# Patient Record
Sex: Male | Born: 1960 | Race: White | Hispanic: No | Marital: Married | Smoking: Current every day smoker
Health system: Southern US, Community
[De-identification: ages and names within clinical notes are randomized; demographics above are authoritative.]

## PROBLEM LIST (undated history)

## (undated) DIAGNOSIS — I10 Essential (primary) hypertension: Secondary | ICD-10-CM

## (undated) DIAGNOSIS — J449 Chronic obstructive pulmonary disease, unspecified: Secondary | ICD-10-CM

## (undated) HISTORY — PX: TONSILLECTOMY: SUR1361

---

## 2004-07-18 ENCOUNTER — Ambulatory Visit: Payer: Self-pay

## 2017-10-16 ENCOUNTER — Telehealth: Payer: Self-pay

## 2017-10-16 ENCOUNTER — Other Ambulatory Visit: Payer: Self-pay

## 2017-10-16 DIAGNOSIS — D508 Other iron deficiency anemias: Secondary | ICD-10-CM

## 2017-10-16 NOTE — Telephone Encounter (Signed)
Pt states he is returning phone call to [facility] best call back # [phone].

## 2017-10-16 NOTE — Telephone Encounter (Signed)
I left a voicemail asking him to call to discuss critical lab values and Dr Leland Her orders.

## 2017-10-16 NOTE — Telephone Encounter (Signed)
Second voicemail.

## 2017-10-16 NOTE — Telephone Encounter (Deleted)
Orders in Epic for blood transfusion, call out to Short Stay at [facility] to schedule.  Will notify patient of plans so far and then will let him know when scheduled.

## 2017-10-17 ENCOUNTER — Telehealth: Payer: Self-pay

## 2017-10-17 NOTE — Telephone Encounter (Signed)
Left message for the patient to call back.  Appointment at [facility] [place] 8 am 10/18/17 [place]

## 2017-10-17 NOTE — Telephone Encounter (Deleted)
-----   Message from Jackquline Denmark, MD sent at 10/16/2017 12:07 PM EDT ----- Regarding: RE: Critical lab Can be please transfuse him 2 units of PRBC, each over 3 hours, Lasix 40 mg IV in between. Posttransfusion CBC May have to arrange it at [place] ----- Message ----- From: Debbora Lacrosse, RN Sent: 10/16/2017  10:47 AM To: Jackquline Denmark, MD Subject: Critical lab                                   Hgb 7.6 Hct 23.9  Please advise.  Thank you.

## 2017-10-18 ENCOUNTER — Encounter (HOSPITAL_COMMUNITY): Payer: Self-pay

## 2019-04-04 ENCOUNTER — Emergency Department
Admission: EM | Admit: 2019-04-04 | Discharge: 2019-04-04 | Disposition: A | Discharge status: Home / Self Care | Payer: BC Managed Care – PPO | Attending: Emergency Medicine | Admitting: Emergency Medicine

## 2019-04-04 ENCOUNTER — Emergency Department: Payer: BC Managed Care – PPO

## 2019-04-04 ENCOUNTER — Other Ambulatory Visit: Payer: Self-pay

## 2019-04-04 ENCOUNTER — Encounter: Payer: Self-pay | Admitting: Emergency Medicine

## 2019-04-04 DIAGNOSIS — F17210 Nicotine dependence, cigarettes, uncomplicated: Secondary | ICD-10-CM | POA: Insufficient documentation

## 2019-04-04 DIAGNOSIS — R05 Cough: Secondary | ICD-10-CM | POA: Diagnosis present

## 2019-04-04 DIAGNOSIS — I10 Essential (primary) hypertension: Secondary | ICD-10-CM | POA: Diagnosis not present

## 2019-04-04 DIAGNOSIS — J40 Bronchitis, not specified as acute or chronic: Secondary | ICD-10-CM | POA: Insufficient documentation

## 2019-04-04 HISTORY — DX: Essential (primary) hypertension: I10

## 2019-04-04 HISTORY — DX: Chronic obstructive pulmonary disease, unspecified: J44.9

## 2019-04-04 MED ORDER — PREDNISONE 10 MG PO TABS: ORAL_TABLET | ORAL | 0 refills | Status: DC

## 2019-04-04 MED ORDER — AZITHROMYCIN 500 MG PO TABS
500.0000 mg | ORAL_TABLET | Freq: Once | ORAL | Status: AC
  Administered 2019-04-04: 500 mg via ORAL
  Filled 2019-04-04: qty 1

## 2019-04-04 MED ORDER — PREDNISONE 20 MG PO TABS
60.0000 mg | ORAL_TABLET | Freq: Once | ORAL | Status: AC
  Administered 2019-04-04: 21:00:00 60 mg via ORAL
  Filled 2019-04-04: qty 3

## 2019-04-04 MED ORDER — AZITHROMYCIN 250 MG PO TABS: ORAL_TABLET | ORAL | 0 refills | Status: DC

## 2019-04-04 NOTE — ED Triage Notes (Signed)
Patient states that he has been congested over the past week. Patient states that he has had some coughing with the mucous. Patient states that today he had some streaks of blood in the mucous.

## 2019-04-04 NOTE — ED Provider Notes (Signed)
[facility] Emergency Department Provider Note  ____________________________________________  Time seen: Approximately 9:12 PM  I have reviewed the triage vital signs and the nursing notes.   HISTORY  Chief Complaint Nasal Congestion    HPI Zlatan Hornback is a 58 y.o. male that presents to emergency department for evaluation of intermittent nonproductive cough for a couple of days.  Patient states that if not really a cough because it is induced as he is trying to get phlegm out.  Patient states that after a very forceful episode of coughing this eveni9ng, he coughed up a streak of blood.  There were no clots.  This is never happened before.  Patient gets bronchitis every year around this time.  This feels like his bronchitis.  He is usually started on steroids and antibiotics.  He smokes a pack of cigarettes per day.  He has a history of COPD.  He does not use any inhalers.  He feels well overall.  No fevers, nasal congestion, shortness of breath, chest pain, vomiting, diarrhea.   Past Medical History:  Diagnosis Date  . COPD (chronic obstructive pulmonary disease) ([place])   . Hypertension     There are no active problems to display for this patient.   Past Surgical History:  Procedure Laterality Date  . TONSILLECTOMY      Prior to Admission medications   Medication Sig Start Date End Date Taking? Authorizing Provider  azithromycin (ZITHROMAX Z-PAK) 250 MG tablet Take 2 tablets (500 mg) on  Day 1,  followed by 1 tablet (250 mg) once daily on Days 2 through 5. 04/04/19   Laban Emperor, PA-C  predniSONE (DELTASONE) 10 MG tablet Take 6 tablets on day 1, take 5 tablets on day 2, take 4 tablets on day 3, take 3 tablets on day 4, take 2 tablets on day 5, take 1 tablet on day 6 04/04/19   Laban Emperor, PA-C    Allergies Lisinopril  No family history on file.  Social History Social History   Tobacco Use  . Smoking status: Current Every Day Smoker  .  Smokeless tobacco: Never Used  Substance Use Topics  . Alcohol use: Not on file  . Drug use: Not on file     Review of Systems  Constitutional: No fever/chills Eyes: No visual changes. No discharge. ENT: Negative for congestion and rhinorrhea. Cardiovascular: No chest pain. Respiratory: Positive for cough. No SOB. Gastrointestinal: No abdominal pain.  No nausea, no vomiting.  No diarrhea.  No constipation. Musculoskeletal: Negative for musculoskeletal pain. Skin: Negative for rash, abrasions, lacerations, ecchymosis. Neurological: Negative for headaches.   ____________________________________________   PHYSICAL EXAM:  VITAL SIGNS: ED Triage Vitals  Enc Vitals Group     BP 04/04/19 1917 (!) 157/116     Pulse Rate 04/04/19 1917 61     Resp 04/04/19 1917 18     Temp 04/04/19 1917 98.7 F (37.1 C)     Temp Source 04/04/19 1917 Oral     SpO2 04/04/19 1917 98 %     Weight 04/04/19 1918 160 lb (72.6 kg)     Height 04/04/19 1918 6' (1.829 m)     Head Circumference --      Peak Flow --      Pain Score 04/04/19 1917 0     Pain Loc --      Pain Edu? --      Excl. in [place]? --      Constitutional: Alert and oriented. Well appearing and  in no acute distress. Eyes: Conjunctivae are normal. PERRL. EOMI. No discharge. Head: Atraumatic. ENT: No frontal and maxillary sinus tenderness.      Ears: Tympanic membranes pearly gray with good landmarks. No discharge.      Nose: No congestion/rhinnorhea.      Mouth/Throat: Mucous membranes are moist. Oropharynx non-erythematous. Tonsils not enlarged. No exudates. Uvula midline. Neck: No stridor.   Hematological/Lymphatic/Immunilogical: No cervical lymphadenopathy. Cardiovascular: Normal rate, regular rhythm.  Good peripheral circulation. Respiratory: Normal respiratory effort without tachypnea or retractions. Lungs CTAB. Good air entry to the bases with no decreased or absent breath sounds. Gastrointestinal: Bowel sounds 4 quadrants. Soft  and nontender to palpation. No guarding or rigidity. No palpable masses. No distention. Musculoskeletal: Full range of motion to all extremities. No gross deformities appreciated. Neurologic:  Normal speech and language. No gross focal neurologic deficits are appreciated.  Skin:  Skin is warm, dry and intact. No rash noted. Psychiatric: Mood and affect are normal. Speech and behavior are normal. Patient exhibits appropriate insight and judgement.   ____________________________________________   LABS (all labs ordered are listed, but only abnormal results are displayed)  Labs Reviewed - No data to display ____________________________________________  EKG  SB ____________________________________________  RADIOLOGY I, Laban Emperor, personally viewed and evaluated these images (plain radiographs) as part of my medical decision making, as well as reviewing the written report by the radiologist.  Dg Chest Portable 1 View  Result Date: 04/04/2019 CLINICAL DATA:  Cough, congestion EXAM: PORTABLE CHEST 1 VIEW COMPARISON:  None. FINDINGS: Heart and mediastinal contours are within normal limits. No focal opacities or effusions. No acute bony abnormality. IMPRESSION: No active disease. Electronically Signed   By: Rolm Baptise M.D.   On: 04/04/2019 20:42    ____________________________________________    PROCEDURES  Procedure(s) performed:    Procedures    Medications  azithromycin (ZITHROMAX) tablet 500 mg (500 mg Oral Given 04/04/19 2119)  predniSONE (DELTASONE) tablet 60 mg (60 mg Oral Given 04/04/19 2119)     ____________________________________________   INITIAL IMPRESSION / ASSESSMENT AND PLAN / ED COURSE  Pertinent labs & imaging results that were available during my care of the patient were reviewed by me and considered in my medical decision making (see chart for details).  Review of the [place] CSRS was performed in accordance of the [place] prior to dispensing any  controlled drugs.   Patient's diagnosis is consistent with bronchitis. Vital signs and exam are reassuring. Patient appears well and is staying well hydrated. Patient should alternate tylenol and ibuprofen for fever. Patient feels comfortable going home. Patient will be discharged home with prescriptions for prednisone and azithromycin. Patient is to follow up with PCP as needed or otherwise directed. Patient is given ED precautions to return to the ED for any worsening or new symptoms.  Laymon Stockert was evaluated in Emergency Department on 04/04/2019 for the symptoms described in the history of present illness. He was evaluated in the context of the global COVID-19 pandemic, which necessitated consideration that the patient might be at risk for infection with the SARS-CoV-2 virus that causes COVID-19. Institutional protocols and algorithms that pertain to the evaluation of patients at risk for COVID-19 are in a state of rapid change based on information released by regulatory bodies including the CDC and federal and state organizations. These policies and algorithms were followed during the patient's care in the ED.   ____________________________________________  FINAL CLINICAL IMPRESSION(S) / ED DIAGNOSES  Final diagnoses:  Bronchitis  NEW MEDICATIONS STARTED DURING THIS VISIT:  ED Discharge Orders         Ordered    azithromycin (ZITHROMAX Z-PAK) 250 MG tablet     04/04/19 2115    predniSONE (DELTASONE) 10 MG tablet     04/04/19 2115              This chart was dictated using voice recognition software/Dragon. Despite best efforts to proofread, errors can occur which can change the meaning. Any change was purely unintentional.    Laban Emperor, PA-C 04/04/19 2128    Harvest Dark, MD 04/04/19 2244

## 2019-08-04 ENCOUNTER — Encounter: Payer: Self-pay | Admitting: Emergency Medicine

## 2019-08-04 ENCOUNTER — Emergency Department: Payer: BC Managed Care – PPO

## 2019-08-04 ENCOUNTER — Inpatient Hospital Stay
Admission: EM | Admit: 2019-08-04 | Discharge: 2019-09-05 | DRG: 208 | Disposition: E | Payer: BC Managed Care – PPO | Attending: Pulmonary Disease | Admitting: Pulmonary Disease

## 2019-08-04 ENCOUNTER — Other Ambulatory Visit: Payer: Self-pay

## 2019-08-04 DIAGNOSIS — C7801 Secondary malignant neoplasm of right lung: Secondary | ICD-10-CM | POA: Diagnosis present

## 2019-08-04 DIAGNOSIS — Z20822 Contact with and (suspected) exposure to covid-19: Secondary | ICD-10-CM | POA: Diagnosis present

## 2019-08-04 DIAGNOSIS — Z66 Do not resuscitate: Secondary | ICD-10-CM | POA: Diagnosis not present

## 2019-08-04 DIAGNOSIS — C801 Malignant (primary) neoplasm, unspecified: Secondary | ICD-10-CM | POA: Diagnosis present

## 2019-08-04 DIAGNOSIS — I10 Essential (primary) hypertension: Secondary | ICD-10-CM | POA: Diagnosis present

## 2019-08-04 DIAGNOSIS — J441 Chronic obstructive pulmonary disease with (acute) exacerbation: Secondary | ICD-10-CM | POA: Diagnosis present

## 2019-08-04 DIAGNOSIS — Z9911 Dependence on respirator [ventilator] status: Secondary | ICD-10-CM | POA: Diagnosis not present

## 2019-08-04 DIAGNOSIS — Z7189 Other specified counseling: Secondary | ICD-10-CM | POA: Diagnosis not present

## 2019-08-04 DIAGNOSIS — C771 Secondary and unspecified malignant neoplasm of intrathoracic lymph nodes: Secondary | ICD-10-CM | POA: Diagnosis present

## 2019-08-04 DIAGNOSIS — C7971 Secondary malignant neoplasm of right adrenal gland: Secondary | ICD-10-CM | POA: Diagnosis present

## 2019-08-04 DIAGNOSIS — K7689 Other specified diseases of liver: Secondary | ICD-10-CM | POA: Diagnosis present

## 2019-08-04 DIAGNOSIS — E440 Moderate protein-calorie malnutrition: Secondary | ICD-10-CM | POA: Diagnosis present

## 2019-08-04 DIAGNOSIS — N2889 Other specified disorders of kidney and ureter: Secondary | ICD-10-CM | POA: Diagnosis present

## 2019-08-04 DIAGNOSIS — Z515 Encounter for palliative care: Secondary | ICD-10-CM | POA: Diagnosis not present

## 2019-08-04 DIAGNOSIS — J440 Chronic obstructive pulmonary disease with acute lower respiratory infection: Secondary | ICD-10-CM | POA: Diagnosis present

## 2019-08-04 DIAGNOSIS — I959 Hypotension, unspecified: Secondary | ICD-10-CM | POA: Diagnosis not present

## 2019-08-04 DIAGNOSIS — F17210 Nicotine dependence, cigarettes, uncomplicated: Secondary | ICD-10-CM | POA: Diagnosis present

## 2019-08-04 DIAGNOSIS — R042 Hemoptysis: Secondary | ICD-10-CM | POA: Diagnosis not present

## 2019-08-04 DIAGNOSIS — J9601 Acute respiratory failure with hypoxia: Secondary | ICD-10-CM | POA: Diagnosis present

## 2019-08-04 DIAGNOSIS — J189 Pneumonia, unspecified organism: Secondary | ICD-10-CM | POA: Diagnosis present

## 2019-08-04 DIAGNOSIS — C3401 Malignant neoplasm of right main bronchus: Secondary | ICD-10-CM | POA: Diagnosis present

## 2019-08-04 DIAGNOSIS — E876 Hypokalemia: Secondary | ICD-10-CM | POA: Diagnosis present

## 2019-08-04 DIAGNOSIS — J9811 Atelectasis: Secondary | ICD-10-CM | POA: Diagnosis present

## 2019-08-04 DIAGNOSIS — Z9981 Dependence on supplemental oxygen: Secondary | ICD-10-CM

## 2019-08-04 DIAGNOSIS — Z6820 Body mass index (BMI) 20.0-20.9, adult: Secondary | ICD-10-CM

## 2019-08-04 DIAGNOSIS — R0902 Hypoxemia: Secondary | ICD-10-CM

## 2019-08-04 DIAGNOSIS — R0602 Shortness of breath: Secondary | ICD-10-CM

## 2019-08-04 DIAGNOSIS — C781 Secondary malignant neoplasm of mediastinum: Secondary | ICD-10-CM | POA: Diagnosis present

## 2019-08-04 DIAGNOSIS — Z4659 Encounter for fitting and adjustment of other gastrointestinal appliance and device: Secondary | ICD-10-CM

## 2019-08-04 DIAGNOSIS — R7989 Other specified abnormal findings of blood chemistry: Secondary | ICD-10-CM

## 2019-08-04 LAB — BASIC METABOLIC PANEL WITH GFR
Anion gap: 14 (ref 5–15)
BUN: 12 mg/dL (ref 8–23)
CO2: 25 mmol/L (ref 22–32)
Calcium: 9.3 mg/dL (ref 8.9–10.3)
Chloride: 93 mmol/L — ABNORMAL LOW (ref 98–111)
Creatinine, Ser: 1.01 mg/dL (ref 0.61–1.24)
GFR calc Af Amer: >60 mL/min
GFR calc non Af Amer: >60 mL/min
Glucose, Bld: 122 mg/dL — ABNORMAL HIGH (ref 70–99)
Potassium: 2.8 mmol/L — ABNORMAL LOW (ref 3.5–5.1)
Sodium: 132 mmol/L — ABNORMAL LOW (ref 135–145)

## 2019-08-04 LAB — CBC WITH DIFFERENTIAL/PLATELET
Abs Immature Granulocytes: 0.19 10*3/uL — ABNORMAL HIGH (ref 0.00–0.07)
Basophils Absolute: 0 10*3/uL (ref 0.0–0.1)
Basophils Relative: 0 %
Eosinophils Absolute: 0 10*3/uL (ref 0.0–0.5)
Eosinophils Relative: 0 %
HCT: 45.3 % (ref 39.0–52.0)
Hemoglobin: 16.1 g/dL (ref 13.0–17.0)
Immature Granulocytes: 1 %
Lymphocytes Relative: 3 %
Lymphs Abs: 0.5 10*3/uL — ABNORMAL LOW (ref 0.7–4.0)
MCH: 34.1 pg — ABNORMAL HIGH (ref 26.0–34.0)
MCHC: 35.5 g/dL (ref 30.0–36.0)
MCV: 96 fL (ref 80.0–100.0)
Monocytes Absolute: 0.6 10*3/uL (ref 0.1–1.0)
Monocytes Relative: 3 %
Neutro Abs: 16.5 10*3/uL — ABNORMAL HIGH (ref 1.7–7.7)
Neutrophils Relative %: 93 %
Platelets: 354 10*3/uL (ref 150–400)
RBC: 4.72 MIL/uL (ref 4.22–5.81)
RDW: 11.6 % (ref 11.5–15.5)
WBC: 17.8 10*3/uL — ABNORMAL HIGH (ref 4.0–10.5)
nRBC: 0 % (ref 0.0–0.2)

## 2019-08-04 LAB — BLOOD GAS, ARTERIAL
Acid-Base Excess: 1.6 mmol/L (ref 0.0–2.0)
Acid-base deficit: 1.1 mmol/L (ref 0.0–2.0)
Acid-base deficit: 1.4 mmol/L (ref 0.0–2.0)
Bicarbonate: 22.7 mmol/L (ref 20.0–28.0)
Bicarbonate: 25.3 mmol/L (ref 20.0–28.0)
Bicarbonate: 26.6 mmol/L (ref 20.0–28.0)
FIO2: 1
FIO2: 1
FIO2: 1
MECHVT: 450 mL
MECHVT: 400 mL
Mechanical Rate: 16
O2 Saturation: 92.2 %
O2 Saturation: 86.4 %
O2 Saturation: 72.3 %
PEEP: 5 cmH2O
PEEP: 10 cmH2O
Patient temperature: 37
Patient temperature: 37
Patient temperature: 37
RATE: 20 resp/min
pCO2 arterial: 26 mmHg — ABNORMAL LOW (ref 32.0–48.0)
pCO2 arterial: 48 mmHg (ref 32.0–48.0)
pCO2 arterial: 58 mmHg — ABNORMAL HIGH (ref 32.0–48.0)
pH, Arterial: 7.55 — ABNORMAL HIGH (ref 7.350–7.450)
pH, Arterial: 7.33 — ABNORMAL LOW (ref 7.350–7.450)
pH, Arterial: 7.27 — ABNORMAL LOW (ref 7.350–7.450)
pO2, Arterial: 55 mmHg — ABNORMAL LOW (ref 83.0–108.0)
pO2, Arterial: 56 mmHg — ABNORMAL LOW (ref 83.0–108.0)
pO2, Arterial: 44 mmHg — ABNORMAL LOW (ref 83.0–108.0)

## 2019-08-04 LAB — CBC
HCT: 44.7 % (ref 39.0–52.0)
Hemoglobin: 16.2 g/dL (ref 13.0–17.0)
MCH: 34.2 pg — ABNORMAL HIGH (ref 26.0–34.0)
MCHC: 36.2 g/dL — ABNORMAL HIGH (ref 30.0–36.0)
MCV: 94.5 fL (ref 80.0–100.0)
Platelets: 322 10*3/uL (ref 150–400)
RBC: 4.73 MIL/uL (ref 4.22–5.81)
RDW: 11.4 % — ABNORMAL LOW (ref 11.5–15.5)
WBC: 14.2 10*3/uL — ABNORMAL HIGH (ref 4.0–10.5)
nRBC: 0 % (ref 0.0–0.2)

## 2019-08-04 LAB — FIBRIN DERIVATIVES D-DIMER (ARMC ONLY): Fibrin derivatives D-dimer (ARMC): 3251.16 ng/mL (FEU) — ABNORMAL HIGH (ref 0.00–499.00)

## 2019-08-04 LAB — PROCALCITONIN
Procalcitonin: 1.83 ng/mL
Procalcitonin: 1.84 ng/mL

## 2019-08-04 LAB — RESPIRATORY PANEL BY RT PCR (FLU A&B, COVID)
Influenza A by PCR: NEGATIVE
Influenza B by PCR: NEGATIVE
SARS Coronavirus 2 by RT PCR: NEGATIVE

## 2019-08-04 LAB — MRSA PCR SCREENING: MRSA by PCR: NEGATIVE

## 2019-08-04 LAB — PROTIME-INR
INR: 0.9 (ref 0.8–1.2)
Prothrombin Time: 12.1 seconds (ref 11.4–15.2)

## 2019-08-04 LAB — CREATININE, SERUM
Creatinine, Ser: 1.21 mg/dL (ref 0.61–1.24)
GFR calc Af Amer: >60 mL/min (ref >60)
GFR calc non Af Amer: >60 mL/min (ref >60)

## 2019-08-04 LAB — MAGNESIUM: Magnesium: 1.7 mg/dL (ref 1.7–2.4)

## 2019-08-04 LAB — LACTIC ACID, PLASMA: Lactic Acid, Venous: 3.4 mmol/L (ref 0.5–1.9)

## 2019-08-04 LAB — BRAIN NATRIURETIC PEPTIDE: B Natriuretic Peptide: 146 pg/mL — ABNORMAL HIGH (ref 0.0–100.0)

## 2019-08-04 LAB — PHOSPHORUS: Phosphorus: 4.8 mg/dL — ABNORMAL HIGH (ref 2.5–4.6)

## 2019-08-04 MED ORDER — PANTOPRAZOLE SODIUM 40 MG IV SOLR
40.0000 mg | Freq: Every day | INTRAVENOUS | Status: DC
  Administered 2019-08-04: 40 mg via INTRAVENOUS
  Filled 2019-08-04: qty 40

## 2019-08-04 MED ORDER — VECURONIUM BROMIDE 10 MG IV SOLR
INTRAVENOUS | Status: AC
  Administered 2019-08-04: 10 mg via INTRAVENOUS
  Filled 2019-08-04: qty 10

## 2019-08-04 MED ORDER — STERILE WATER FOR INJECTION IV SOLN
INTRAVENOUS | Status: DC
  Filled 2019-08-04 (×2): qty 850

## 2019-08-04 MED ORDER — LACTATED RINGERS IV BOLUS
500.0000 mL | Freq: Once | INTRAVENOUS | Status: AC
  Administered 2019-08-04: 500 mL via INTRAVENOUS

## 2019-08-04 MED ORDER — POTASSIUM CHLORIDE 10 MEQ/100ML IV SOLN
10.0000 meq | INTRAVENOUS | Status: AC
  Administered 2019-08-04 – 2019-08-05 (×4): 10 meq via INTRAVENOUS
  Filled 2019-08-04 (×4): qty 100

## 2019-08-04 MED ORDER — FENTANYL CITRATE (PF) 100 MCG/2ML IJ SOLN
50.0000 ug | Freq: Once | INTRAMUSCULAR | Status: AC
  Administered 2019-08-04: 50 ug via INTRAVENOUS

## 2019-08-04 MED ORDER — ETOMIDATE 2 MG/ML IV SOLN
30.0000 mg | Freq: Once | INTRAVENOUS | Status: AC
  Administered 2019-08-04: 30 mg via INTRAVENOUS

## 2019-08-04 MED ORDER — METHYLPREDNISOLONE SODIUM SUCC 125 MG IJ SOLR: 80.0000 mg | Freq: Once | INTRAMUSCULAR | Status: AC

## 2019-08-04 MED ORDER — CHLORHEXIDINE GLUCONATE 0.12% ORAL RINSE (MEDLINE KIT)
15.0000 mL | Freq: Two times a day (BID) | OROMUCOSAL | Status: DC
  Administered 2019-08-04 – 2019-08-05 (×2): 15 mL via OROMUCOSAL

## 2019-08-04 MED ORDER — FENTANYL 2500MCG IN NS 250ML (10MCG/ML) PREMIX INFUSION
0.0000 ug/h | INTRAVENOUS | Status: DC
  Administered 2019-08-04: 50 ug/h via INTRAVENOUS
  Administered 2019-08-05: 400 ug/h via INTRAVENOUS
  Filled 2019-08-04 (×4): qty 250

## 2019-08-04 MED ORDER — ARTIFICIAL TEARS OPHTHALMIC OINT
1.0000 "application " | TOPICAL_OINTMENT | Freq: Three times a day (TID) | OPHTHALMIC | Status: DC
  Administered 2019-08-05 (×2): 1 via OPHTHALMIC
  Filled 2019-08-04 (×2): qty 3.5

## 2019-08-04 MED ORDER — SUCCINYLCHOLINE CHLORIDE 20 MG/ML IJ SOLN
120.0000 mg | Freq: Once | INTRAMUSCULAR | Status: AC
  Administered 2019-08-04: 120 mg via INTRAVENOUS

## 2019-08-04 MED ORDER — POTASSIUM CHLORIDE 10 MEQ/100ML IV SOLN
10.0000 meq | INTRAVENOUS | Status: DC
  Administered 2019-08-04: 10 meq via INTRAVENOUS
  Filled 2019-08-04 (×2): qty 100

## 2019-08-04 MED ORDER — MIDAZOLAM HCL 2 MG/2ML IJ SOLN: 2.0000 mg | INTRAMUSCULAR | Status: DC | PRN

## 2019-08-04 MED ORDER — PROPOFOL 1000 MG/100ML IV EMUL
5.0000 ug/kg/min | INTRAVENOUS | Status: DC
  Administered 2019-08-04: 70 ug/kg/min via INTRAVENOUS
  Administered 2019-08-04: 10 ug/kg/min via INTRAVENOUS
  Administered 2019-08-04: 70 ug/kg/min via INTRAVENOUS
  Administered 2019-08-05: 55 ug/kg/min via INTRAVENOUS
  Administered 2019-08-05: 50 ug/kg/min via INTRAVENOUS
  Administered 2019-08-05: 55 ug/kg/min via INTRAVENOUS
  Filled 2019-08-04 (×6): qty 100

## 2019-08-04 MED ORDER — IPRATROPIUM-ALBUTEROL 0.5-2.5 (3) MG/3ML IN SOLN
3.0000 mL | RESPIRATORY_TRACT | Status: DC
  Administered 2019-08-04 – 2019-08-05 (×5): 3 mL via RESPIRATORY_TRACT
  Filled 2019-08-04 (×5): qty 3

## 2019-08-04 MED ORDER — CISATRACURIUM BOLUS VIA INFUSION
0.0500 mg/kg | Freq: Once | INTRAVENOUS | Status: AC
  Administered 2019-08-05: 3.3 mg via INTRAVENOUS
  Filled 2019-08-04: qty 4

## 2019-08-04 MED ORDER — SODIUM CHLORIDE 0.9 % IV SOLN
0.0000 ug/kg/min | INTRAVENOUS | Status: DC
  Administered 2019-08-05: 3 ug/kg/min via INTRAVENOUS
  Filled 2019-08-04: qty 20

## 2019-08-04 MED ORDER — METHYLPREDNISOLONE SODIUM SUCC 125 MG IJ SOLR
INTRAMUSCULAR | Status: AC
  Administered 2019-08-04: 80 mg via INTRAVENOUS
  Filled 2019-08-04: qty 2

## 2019-08-04 MED ORDER — SODIUM CHLORIDE 0.9 % IV SOLN
2.0000 g | Freq: Three times a day (TID) | INTRAVENOUS | Status: DC
  Administered 2019-08-04 – 2019-08-05 (×3): 2 g via INTRAVENOUS
  Filled 2019-08-04 (×6): qty 2

## 2019-08-04 MED ORDER — CHLORHEXIDINE GLUCONATE CLOTH 2 % EX PADS
6.0000 | MEDICATED_PAD | Freq: Every day | CUTANEOUS | Status: DC
  Administered 2019-08-04 – 2019-08-05 (×2): 6 via TOPICAL

## 2019-08-04 MED ORDER — METHYLPREDNISOLONE SODIUM SUCC 40 MG IJ SOLR
40.0000 mg | Freq: Four times a day (QID) | INTRAMUSCULAR | Status: DC
  Administered 2019-08-05 (×3): 40 mg via INTRAVENOUS
  Filled 2019-08-04 (×3): qty 1

## 2019-08-04 MED ORDER — ORAL CARE MOUTH RINSE
15.0000 mL | OROMUCOSAL | Status: DC
  Administered 2019-08-04 – 2019-08-05 (×9): 15 mL via OROMUCOSAL

## 2019-08-04 MED ORDER — ASPIRIN 300 MG RE SUPP: 300.0000 mg | RECTAL | Status: DC

## 2019-08-04 MED ORDER — FENTANYL BOLUS VIA INFUSION
50.0000 ug | INTRAVENOUS | Status: DC | PRN
  Administered 2019-08-05: 50 ug via INTRAVENOUS
  Filled 2019-08-04: qty 50

## 2019-08-04 MED ORDER — VECURONIUM BROMIDE 10 MG IV SOLR: 10.0000 mg | Freq: Once | INTRAVENOUS | Status: AC

## 2019-08-04 MED ORDER — BUDESONIDE 0.5 MG/2ML IN SUSP
0.5000 mg | Freq: Two times a day (BID) | RESPIRATORY_TRACT | Status: DC
  Administered 2019-08-04 – 2019-08-05 (×2): 0.5 mg via RESPIRATORY_TRACT
  Filled 2019-08-04 (×2): qty 2

## 2019-08-04 MED ORDER — MIDAZOLAM HCL 2 MG/2ML IJ SOLN
INTRAMUSCULAR | Status: AC
  Administered 2019-08-04: 2 mg via INTRAVENOUS
  Filled 2019-08-04: qty 2

## 2019-08-04 MED ORDER — ENOXAPARIN SODIUM 40 MG/0.4ML ~~LOC~~ SOLN
40.0000 mg | SUBCUTANEOUS | Status: DC
  Administered 2019-08-04: 40 mg via SUBCUTANEOUS
  Filled 2019-08-04: qty 0.4

## 2019-08-04 MED ORDER — MIDAZOLAM HCL 2 MG/2ML IJ SOLN
2.0000 mg | Freq: Once | INTRAMUSCULAR | Status: AC
  Administered 2019-08-04: 2 mg via INTRAVENOUS

## 2019-08-04 MED ORDER — MIDAZOLAM HCL 2 MG/2ML IJ SOLN: 2.0000 mg | Freq: Once | INTRAMUSCULAR | Status: DC

## 2019-08-04 MED ORDER — VANCOMYCIN HCL 1500 MG/300ML IV SOLN
1500.0000 mg | Freq: Once | INTRAVENOUS | Status: AC
  Administered 2019-08-04: 1500 mg via INTRAVENOUS
  Filled 2019-08-04: qty 300

## 2019-08-04 MED ORDER — VECURONIUM BROMIDE 10 MG IV SOLR
10.0000 mg | INTRAVENOUS | Status: DC | PRN
  Administered 2019-08-04: 10 mg via INTRAVENOUS
  Filled 2019-08-04: qty 10

## 2019-08-04 MED ORDER — VANCOMYCIN HCL 1500 MG/300ML IV SOLN
1500.0000 mg | INTRAVENOUS | Status: DC
  Filled 2019-08-04: qty 300

## 2019-08-04 MED ORDER — ALBUTEROL (5 MG/ML) CONTINUOUS INHALATION SOLN
2.5000 mg/h | INHALATION_SOLUTION | RESPIRATORY_TRACT | Status: DC
  Filled 2019-08-04: qty 20

## 2019-08-04 MED ORDER — ASPIRIN 81 MG PO CHEW: 324.0000 mg | CHEWABLE_TABLET | ORAL | Status: DC

## 2019-08-04 MED ORDER — IOHEXOL 350 MG/ML SOLN
75.0000 mL | Freq: Once | INTRAVENOUS | Status: AC | PRN
  Administered 2019-08-04: 75 mL via INTRAVENOUS

## 2019-08-04 MED ORDER — PROPOFOL 1000 MG/100ML IV EMUL
INTRAVENOUS | Status: AC
  Filled 2019-08-04: qty 100

## 2019-08-04 NOTE — ED Triage Notes (Signed)
Patient from home via [facility]. Patient with history of COPD, seen by Dr. Vella Kohler at [facility]. Patient reports worsening SOB x2 weeks. States he was started on 2 new inhalers and has not seen any improvement. Denies fever, cough or congestion.   Per EMS, patient was 77% on RA. Placed on 15L non-rebreather and given 125mg  Solu-medrol. Patient improved to 94% on 15 L. Patient does not use home O2.

## 2019-08-04 NOTE — ED Provider Notes (Signed)
[facility] Emergency Department Provider Note  ____________________________________________   I have reviewed the triage vital signs and the nursing notes.   HISTORY  Chief Complaint Shortness of Breath   History limited by: Not Limited   HPI Richard Mora is a 59 y.o. male who presents to the emergency department today because of concern for shortness of breath. He started having shortness of breath roughly 2 months ago. Has been seen by his PCP as well as pulmonology. States he was given inhalers to use at home as well as a nebulizer. Has not noticed any significant relief. No significant chest pain or cough. The patient denies any previous history of lung disease. Does have 40 year history of smoking.    Records reviewed. Per medical record review patient has a history of COPD, HTN.   Past Medical History:  Diagnosis Date  . COPD (chronic obstructive pulmonary disease) ([place])   . Hypertension     There are no problems to display for this patient.   Past Surgical History:  Procedure Laterality Date  . TONSILLECTOMY      Prior to Admission medications   Medication Sig Start Date End Date Taking? Authorizing Provider  azithromycin (ZITHROMAX Z-PAK) 250 MG tablet Take 2 tablets (500 mg) on  Day 1,  followed by 1 tablet (250 mg) once daily on Days 2 through 5. 04/04/19   Laban Emperor, PA-C  predniSONE (DELTASONE) 10 MG tablet Take 6 tablets on day 1, take 5 tablets on day 2, take 4 tablets on day 3, take 3 tablets on day 4, take 2 tablets on day 5, take 1 tablet on day 6 04/04/19   Laban Emperor, PA-C    Allergies Lisinopril  No family history on file.  Social History Social History   Tobacco Use  . Smoking status: Current Every Day Smoker  . Smokeless tobacco: Never Used  Substance Use Topics  . Alcohol use: Not on file  . Drug use: Not on file    Review of Systems Constitutional: No fever/chills Eyes: No visual changes. ENT: No  sore throat. Cardiovascular: Denies chest pain. Respiratory: Positive for shortness of breath. Gastrointestinal: No abdominal pain.  No nausea, no vomiting.  No diarrhea.   Genitourinary: Negative for dysuria. Musculoskeletal: Negative for back pain. Skin: Negative for rash. Neurological: Negative for headaches, focal weakness or numbness.  ____________________________________________   PHYSICAL EXAM:  VITAL SIGNS: ED Triage Vitals  Enc Vitals Group     BP 07/11/2019 1515 (!) 153/89     Pulse Rate 07/25/2019 1515 77     Resp 08/03/2019 1515 (!) 21     Temp 07/30/2019 1515 (!) 97.5 F (36.4 C)     Temp Source 07/26/2019 1515 Axillary     SpO2 07/25/2019 1515 94 %     Weight 07/11/2019 1519 147 lb (66.7 kg)     Height 07/21/2019 1519 6' (1.829 m)     Head Circumference --      Peak Flow --      Pain Score 07/07/2019 1518 5   Constitutional: Alert and oriented.  Eyes: Conjunctivae are normal.  ENT      Head: Normocephalic and atraumatic.      Nose: No congestion/rhinnorhea.      Mouth/Throat: Mucous membranes are moist.      Neck: No stridor. Hematological/Lymphatic/Immunilogical: No cervical lymphadenopathy. Cardiovascular: Normal rate, regular rhythm.  No murmurs, rubs, or gallops.  Respiratory: Increased respiratory effort. Decreased breath sounds in the right lung.  Gastrointestinal: Soft and non tender. No rebound. No guarding.  Genitourinary: Deferred Musculoskeletal: Normal range of motion in all extremities. No lower extremity edema. Neurologic:  Normal speech and language. No gross focal neurologic deficits are appreciated.  Skin:  Skin is warm, dry and intact. No rash noted. Psychiatric: Mood and affect are normal. Speech and behavior are normal. Patient exhibits appropriate insight and judgment.  ____________________________________________    LABS (pertinent positives/negatives)  COVID negative BMP na 132, k 2.8, cl 93, glu 122, cr 1.01 CBC wbc 14.2, hgb 16.2, plt  322  ____________________________________________   EKG  I, Nance Pear, attending physician, personally viewed and interpreted this EKG  EKG Time: 1520 Rate: 78 Rhythm: ectopic atrial rhythm Axis: normal Intervals: qtc 450 QRS: narrow ST changes: no st elevation Impression: abnormal ekg  ____________________________________________    RADIOLOGY  CXR Right sided volume loss, could be related to upper lobe collapse.  Patchy consolidation in the right lung base  CXR ET tube ends 4.3 cm above carina.   CT angio Obstructing mass in right upper lobe bronchus. ____________________________________________   PROCEDURES  Procedures  CRITICAL CARE Performed by: Nance Pear   Total critical care time: 55 minutes  Critical care time was exclusive of separately billable procedures and treating other patients.  Critical care was necessary to treat or prevent imminent or life-threatening deterioration.  Critical care was time spent personally by me on the following activities: development of treatment plan with patient and/or surrogate as well as nursing, discussions with consultants, evaluation of patient's response to treatment, examination of patient, obtaining history from patient or surrogate, ordering and performing treatments and interventions, ordering and review of laboratory studies, ordering and review of radiographic studies, pulse oximetry and re-evaluation of patient's condition.  INTUBATION Performed by: Nance Pear  Required items: required blood products, implants, devices, and special equipment available Patient identity confirmed: provided demographic data and hospital-assigned identification number Time out: Immediately prior to procedure a "time out" was called to verify the correct patient, procedure, equipment, support staff and site/side marked as required.  Indications: hypoxia, respiratory failure  Intubation method: Glidescope    Preoxygenation: BVM, high flow nasal cannula  Sedatives: Etomidate Paralytic: Succinylcholine  Tube Size: 7.5 cuffed  Post-procedure assessment: chest rise and ETCO2 monitor Breath sounds: equal and absent over the epigastrium Tube secured with: ETT holder Chest x-ray interpreted by radiologist and me.  Chest x-ray findings: endotracheal tube in appropriate position  Patient tolerated the procedure well with no immediate complications.    ____________________________________________   INITIAL IMPRESSION / ASSESSMENT AND PLAN / ED COURSE  Pertinent labs & imaging results that were available during my care of the patient were reviewed by me and considered in my medical decision making (see chart for details).   Patient presented to the emergency department today because of concerns for increasing shortness of breath.  Shortness of breath has been ongoing for roughly 2 months.  On exam patient was found to be significantly hypoxic.  He was placed initially on nonrebreather.  Continued with his hypoxia so it was switched to high flow nasal cannula.  Chest x-ray was performed which showed likely right upper lobe collapse.  Patient continued to be hypoxic on the high flow nasal cannula so was put on high flow nasal cannula as well as nonrebreather.  Even with both these modalities continue to have sats in the 80s and then dropped into the mid 70s.  At this point I had a discussion with the patient  about intubation.  Did discuss risk of intubation although I feel at this point the benefit greatly outweighs the risk.  Patient tolerated intubation well.  CT scan was then able to be obtained which showed an obstructing mass in his right main bronchus.  This would certainly explain the patient's symptoms.  Patient will be admitted to the ICU. ____________________________________________   FINAL CLINICAL IMPRESSION(S) / ED DIAGNOSES  Final diagnoses:  Shortness of breath  Hypoxia  Cancer  ([place])     Note: This dictation was prepared with Dragon dictation. Any transcriptional errors that result from this process are unintentional     Nance Pear, MD 07/14/2019 [phone]

## 2019-08-04 NOTE — ED Notes (Signed)
Pt to CT

## 2019-08-04 NOTE — Progress Notes (Signed)
Pt transported to ICU on Vent without incident. Report given to ICU RT.

## 2019-08-04 NOTE — ED Notes (Signed)
Family updated as to patient's status. Py continues to fight sedation. Propofol increased.

## 2019-08-04 NOTE — Progress Notes (Signed)
Pt's ABG's reviewed (7.27 / 58 / 44 / 26.6).  RR was increased to 24, however pt became more hypoxic with rate change, as SpO2 sats dropped to 40's.  RR changed back to 20 with improvement of O2 saturations to 50-60's.  Will leave at rate of 20, and allow for respiratory acidosis for now.  Follow up ABG at midnight.    Darel Hong, AGACNP-BC [facility] Pager: [phone]

## 2019-08-04 NOTE — H&P (Signed)
Name: Richard Mora MRN: [id number] DOB: 1960-08-18    ADMISSION DATE:  07/18/2019 CONSULTATION DATE:  07/17/2019  REFERRING MD :  Dr. Archie Balboa  CHIEF COMPLAINT:  Shortness of Breath  BRIEF PATIENT DESCRIPTION:  59 y.o. 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  Oncology has been consulted.   SIGNIFICANT EVENTS  3/30: Presented to ED. Significant hypoxia requiring intubation in ED. 3/30: Significant Hypoxia upon arrival to ICU; Tidal Volume decreased to 400 ml and RR increased to 20 bpm with improvement in O2 sats  STUDIES:  3/30: CXR>>1. Endotracheal tube tip about 4.2 cm superior to carina. Esophageal tube tip overlies GE junction region, consider further advancement for more optimal positioning 2. Volume loss within the right thorax with shift of contents to the right. Increasing airspace disease at the right base. Further evaluation with chest CT previously suggest. 3/30: CTA Chest>> 1. Soft tissue mass obstructing the right upper lobe bronchus compatible with malignancy. This is amenable to endobronchial sampling. 2. No evidence of pulmonary embolus. 3. Metastatic adenopathy throughout the mediastinum and right hilum. 4. Cystic and solid right adrenal mass highly concerning for metastatic disease. 5. Indeterminate lesion upper pole right kidney may require further evaluation with MRI or ultrasound. 6. Extensive postobstructive changes within the right lung, with complete atelectasis of the right lower lobe and partial right middle lobe atelectasis. Likely postobstructive pneumonia within the right upper lobe. 7. Support devices as above.  CULTURES: SARS-CoV-2 PCR 3/30>> negative Influenza PCR 3/30>> negative Blood culture 3/30>> Tracheal aspirate  3/30>> Strep pneumo urinary antigen 3/30>> Legionella urinary antigen 3/30>>  ANTIBIOTICS: Cefepime 3/30>> Vancomycin 3/30>>  HISTORY OF PRESENT ILLNESS:   Richard Mora is a 59 year old male with a past medical history notable for COPD, hypertension, former smoker (40-year history of smoking).  He presents to [facility] ED on 07/22/2019 with complaints of shortness of breath.  He is currently intubated and sedated, no family is present, therefore history is obtained from ED and nursing notes.  Per notes he reported an approximate 40-monthhistory of shortness of breath, which is progressively worsened over the past week.  He was seen by his Pulmonologist Dr. FRaul Delon 07/21/2019 for COPD exacerbation, and was given inhalers, nebulizers, and prednisone, but did not obtain any significant relief.  He denied chest pain or cough.  Upon presentation to ED he was noted to be severely hypoxic, which he was placed on non-rebreather.  Despite nonrebreather he remains significantly hypoxic with O2 sats ranging from the 70s to 80s, which he was subsequently intubated.  Chest x-ray showed right upper lobe collapse.  CTA Chest was obtained which revealed an obstructing mass of the right mainstem bronchus consistent with central obstructing neoplasm, along with groundglass airspace disease of the right upper lobe consistent with postobstructive pneumonia, along with emphysema.  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  Lab work was concerning for sodium 132, potassium 2.8, WBC 14.2, procalcitonin 1.83, lactic acid 3.4, BNP 146, D-dimer 3251.  His SARS-CoV-2 PCR is negative, influenza PCR is negative.  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  Oncology  has been consulted.  After arrival to ICU he remained severely hypoxic despite 100% FiO2 on the vent and 10 of PEEP.  He required Nimbex drip and positioning on left side (right lung up) with noted improvement and O2 saturations.  Given his critical illness and poor prognosis his wife decided to make him DNR/DNI.  Palliative care has been consulted with further assistance in establishing goals of care.    PAST MEDICAL HISTORY :   has a past medical history of COPD (chronic obstructive pulmonary disease) ([place]) and Hypertension.  has a past surgical history that includes Tonsillectomy. Prior to Admission medications   Medication Sig Start Date End Date Taking? Authorizing Provider  albuterol (VENTOLIN HFA) 108 (90 Base) MCG/ACT inhaler Inhale 1-2 puffs into the lungs every 4 (four) hours as needed for shortness of breath or wheezing. 08/03/19  Yes [provider]  ANORO ELLIPTA 62.5-25 MCG/INH AEPB Inhale 1 puff into the lungs daily. 07/17/19  Yes [provider]  aspirin EC 81 MG tablet Take 81 mg by mouth daily.   Yes [provider]  benzonatate (TESSALON) 100 MG capsule Take 100 mg by mouth 3 (three) times daily as needed for cough. 07/20/19  Yes [provider]  hydrochlorothiazide (HYDRODIURIL) 12.5 MG tablet Take 12.5 mg by mouth daily. 08/01/19  Yes [provider]  metoprolol succinate (TOPROL-XL) 50 MG 24 hr tablet Take 50 mg by mouth 2 (two) times daily. 07/31/19  Yes [provider]  montelukast (SINGULAIR) 10 MG tablet Take 10 mg by mouth at bedtime. 07/21/19  Yes [provider]  Multiple Vitamin (MULTIVITAMIN WITH MINERALS) TABS tablet Take 1 tablet by mouth daily.   Yes [provider]  predniSONE (DELTASONE) 10 MG tablet Take 10 mg by mouth daily. 07/17/19  Yes [provider]  TIADYLT ER 420 MG 24 hr capsule Take 420 mg by mouth daily. 07/31/19  Yes [provider]   Allergies  Allergen Reactions  . Lisinopril Swelling    Swelling of the tongue     FAMILY HISTORY:  family history is not on file. SOCIAL HISTORY:  reports that he has been smoking. He has never used smokeless tobacco.   COVID-19 DISASTER DECLARATION:  FULL CONTACT PHYSICAL EXAMINATION WAS NOT POSSIBLE DUE TO TREATMENT OF COVID-19 AND  CONSERVATION OF PERSONAL PROTECTIVE EQUIPMENT, LIMITED EXAM FINDINGS INCLUDE-  Patient assessed or the symptoms described in the history of present illness.  In the context of the Global COVID-19 pandemic, which necessitated consideration that the patient might be at risk for infection with the SARS-CoV-2 virus that causes COVID-19, Institutional protocols and algorithms that pertain to the evaluation of patients at risk for COVID-19 are in a state of rapid change based on information released by regulatory bodies including the CDC and federal and state organizations. These policies and algorithms were followed during the patient's care while in hospital.  REVIEW OF SYSTEMS:   Unable to assess due to intubation and sedation  SUBJECTIVE:  Unable to assess due to intubation and sedation  VITAL SIGNS: Temp:  [97.5 F (36.4 C)] 97.5 F (36.4 C) (03/30 1515) Pulse Rate:  [70-113] 83 (03/30 1934) Resp:  [18-30] 23 (03/30 1934) BP: (90-208)/(65-99) 109/76 (03/30 1934) SpO2:  [67 %-100 %] 90 % (03/30 1934) FiO2 (%):  [100 %] 100 % (03/30 1934) Weight:  [66.7 kg] 66.7 kg (03/30 1519)  PHYSICAL EXAMINATION: General:  Acutely ill appearing male, laying in bed, intubated and sedated, in NAD Neuro:  Sedated, withdraws from pain HEENT:  Atraumatic, normocephalic, neck supple, no JVD Cardiovascular:  Regular rate & rhythm, s1s2, no M/R/G, 2+ pulses Lungs:  Severely diminished to right side, mild expiratory wheezing to left, vent assisted, even, nonlabored Abdomen:  Soft, nontender, nondistended, no guarding or rebound tenderness, BS+  x4 Musculoskeletal:  Normal bulk and tone, no deformities, no edema Skin:  Warm and dry.  No obvious rashes, lesions, or ulcerations  Recent Labs  Lab 07/20/2019 1519  NA 132*  K 2.8*  CL 93*  CO2 25  BUN 12  CREATININE 1.01  GLUCOSE 122*   Recent Labs  Lab 08/01/2019 1519  HGB 16.2  HCT 44.7  WBC 14.2*  PLT 322   CT Angio Chest PE W and/or Wo Contrast  Result Date: 07/20/2019 CLINICAL DATA:  Short of breath, hypoxic, respiratory failure, COPD EXAM: CT ANGIOGRAPHY CHEST WITH CONTRAST TECHNIQUE: Multidetector CT imaging of the chest was performed using the standard protocol during bolus administration of intravenous contrast. Multiplanar CT image reconstructions and MIPs were obtained to evaluate the vascular anatomy. CONTRAST:  47m OMNIPAQUE IOHEXOL 350 MG/ML SOLN COMPARISON:  07/31/2019, 04/04/2019 FINDINGS: Cardiovascular: This is a technically adequate evaluation of the pulmonary vasculature. There are no filling defects or pulmonary emboli. The heart is unremarkable without pericardial effusion. Thoracic aorta is normal in caliber without aneurysm or dissection. Mild atherosclerosis of the coronary vessels. Mediastinum/Nodes: There is obstructing soft tissue mass occluding the right mainstem bronchus, measuring 3.7 x 2.7 cm on axial image 41, and extending approximately 3.2 cm in craniocaudal direction coronal image 46. Findings are compatible with central obstructing neoplasm. Pathologic adenopathy is seen within the pretracheal, right hilar, subcarinal, and AP window distributions. Largest lymph node in the precarinal region measures 19 mm and in the subcarinal region measures 23 mm in short axis. Enteric catheter extends into the gastric lumen. Thyroid is unremarkable. Endotracheal tube is identified, tip well above carina. Lungs/Pleura: Soft tissue mass obstructing the right mainstem bronchus is noted. There is complete atelectasis of the right lower lobe. Patchy atelectasis of the  right middle lobe is noted. Nodular ground-glass airspace disease within the right upper lobe consistent with postobstructive pneumonia. There is background emphysema. The left chest is clear. No pleural effusion or pneumothorax. 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 1.7 cm cyst within the right lobe liver is noted. Indeterminate mass arises from the upper pole right kidney measuring 3.3 x 3.7 cm, mean Hounsfield attenuation of 21. Other bilateral renal cysts are noted. Musculoskeletal: There are no acute displaced fractures. Nonspecific sclerotic foci right aspect T9 vertebral body and anterior aspect T12 vertebral body. No destructive lesions. Reconstructed images demonstrate no additional findings. Review of the MIP images confirms the above findings. IMPRESSION: 1. Soft tissue mass obstructing the right upper lobe bronchus compatible with malignancy. This is amenable to endobronchial sampling. 2. No evidence of pulmonary embolus. 3. Metastatic adenopathy throughout the mediastinum and right hilum. 4. Cystic and solid right adrenal mass highly concerning for metastatic disease. 5. Indeterminate lesion upper pole right kidney may require further evaluation with MRI or ultrasound. 6. Extensive postobstructive changes within the right lung, with complete atelectasis of the right lower lobe and partial right middle lobe atelectasis. Likely postobstructive pneumonia within the right upper lobe. 7. Support devices as above. Electronically Signed   By: MRanda NgoM.D.   On: 07/08/2019 18:08   DG Chest Portable 1  View  Result Date: 07/28/2019 CLINICAL DATA:  Post intubation EXAM: PORTABLE CHEST 1 VIEW COMPARISON:  07/11/2019, 04/04/2019 FINDINGS: Interval intubation, tip of the endotracheal tube is about 4.2 cm superior to carina. Placement of esophageal tube, the tip projects over the GE junction region. Left lung  grossly clear. Volume loss right hemithorax with shift to the right. Increasing opacity at the right base. Stable cardiomediastinal silhouette. IMPRESSION: 1. Endotracheal tube tip about 4.2 cm superior to carina. Esophageal tube tip overlies GE junction region, consider further advancement for more optimal positioning 2. Volume loss within the right thorax with shift of contents to the right. Increasing airspace disease at the right base. Further evaluation with chest CT previously suggest. Electronically Signed   By: Donavan Foil M.D.   On: 07/06/2019 17:20   DG Chest Portable 1 View  Result Date: 07/23/2019 CLINICAL DATA:  Worsening shortness of breath, hypertension, tobacco abuse EXAM: PORTABLE CHEST 1 VIEW COMPARISON:  04/04/2019 FINDINGS: Single frontal view of the chest demonstrates new volume loss right hemithorax. There is elevation of the right mid diaphragm, mediastinal shift to the right, increased density in the right paratracheal region. This could reflect right upper lobe collapse. Patchy consolidation at the right lung base could reflect superimposed airspace disease. Left chest is clear. No acute bony abnormalities. IMPRESSION: 1. Right-sided volume loss, which may reflect right upper lobe collapse. 2. Patchy consolidation at the right lung base which may be hypoventilatory or airspace disease. 3. Further evaluation with CT chest with contrast may be useful. Electronically Signed   By: Randa Ngo M.D.   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 Lanney Gins who will evaluate patient in a.m. 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 DISCUSSED WITH HER HIS CRITICAL STATUS AND HIGH RISK FOR CARDIAC ARREST AT THIS TIME GIVEN HIS SEVERE HYPOXIA, SHE DECIDED TO MAKE HER HUSBAND DNR/DNI AND CONTINUE WITH AGGRESSIVE TREATMENT MEASURES  Darel Hong, AGACNP-BC [place] [facility] Pager: [phone]  08/03/2019, 8:06 PM

## 2019-08-04 NOTE — Progress Notes (Signed)
Pt again given Vecuronium and placed on left side (right side up) with improvement of O2 sats to 88 to 90%.  Will place on Nimbex gtt and keep positioned on left side.  Will also place on Bicarb gtt for now.   Darel Hong, AGACNP-BC  Pulmonary & Critical Care Medicine Pager: [phone]

## 2019-08-04 NOTE — ED Notes (Addendum)
Pt status continues to worsen. Pt unable to bring sats up even with NRB and high flow O2. Pt to be intubated. Pt gives verbal consent to intubate. Tube is 7.5 with positive color change, secured at 26 at the lip.

## 2019-08-04 NOTE — Progress Notes (Signed)
Patient transported to CT on transport vent.

## 2019-08-04 NOTE — ED Notes (Signed)
Resp called for abg and hi flow

## 2019-08-04 NOTE — Progress Notes (Signed)
Pharmacy Antibiotic Note  Richard Mora is a 59 y.o. male admitted on 07/29/2019. Pharmacy has been consulted for vancomycin and cefepime dosing for pneumonia.  Plan: Vancomycin 1500 mg IV x 1 for loading dose followed by 1500 mg IV q24h Expected AUC: 483 SCr used: 1.01   Height: 6' (182.9 cm) Weight: 147 lb (66.7 kg) IBW/kg (Calculated) : 77.6  Temp (24hrs), Avg:97.5 F (36.4 C), Min:97.5 F (36.4 C), Max:97.5 F (36.4 C)  Recent Labs  Lab 08/03/2019 1519  WBC 14.2*  CREATININE 1.01    Estimated Creatinine Clearance: 72.5 mL/min (by C-G formula based on SCr of 1.01 mg/dL).    Allergies  Allergen Reactions  . Lisinopril Swelling    Swelling of the tongue     Antimicrobials this admission: Vancomycin 3/30 >> Cefepime 3/30 >>  Dose adjustments this admission: NA  Microbiology results: 3/30 BCx: pending 3/30 RespCx: pending  3/30 MRSA PCR: pending  Thank you for allowing pharmacy to be a part of this patient's care.  Tawnya Crook, PharmD 07/29/2019 8:18 PM

## 2019-08-04 NOTE — Progress Notes (Signed)
Pt's O2 sats maintaining 96-99%.  Had previously called pt's wife to bedside during worsening hypoxia.  She is now at bedside, and we again discussed code status and that in the event of a code we could possibly cause rib fracture leading to PTX of left lung, and that CPR would likely not change his prognosis given his obstructive lung mass with mets.  His wife is in agreement with changing his code status to DNR/DNI.  Orders placed.  All questions answered.  She is very appreciative of care.   Darel Hong, AGACNP-BC [place] [facility] Pager: [phone]

## 2019-08-04 NOTE — Progress Notes (Signed)
Pt arrived to ICU from ED, remains severely hypoxic (O2 sats in the 60's) despite 100% FiO2 and 5 peep on vent.  Pt placed on 10 PEEP.  Pt placed on fentanyl gtt (in addition to propofol infusion) and given Vecuronium to assist with vent synchrony. Pt has been placed on broad spectrum coverage for post obstructive PNA.  Called and discussed with Dr. Emmit Alexanders of [facility] for additional recommendations given pt's severe hypoxia.  Per Dr. Emmit Alexanders, he recommends low tidal volumes (400 cc) and increased RR depending on ETCO2,  and holding off on recruitment maneuvers, as pt's volumes are likely only going to the left lung given his right obstructive mass which places him at high risk for developing PTX of left lung.  Dr. Emmit Alexanders recommends treatment measures for COPD as left lung appears hyperinflated on CXR, and pt with hx of COPD.  Orders placed for IV and nebulized steroids and bronchodilators.    Discussed with pt's wife given his severe hypoxia that he is high risk for cardiac arrest and death.  She acknowledges his critical illness, but would like for her husband to remain Full Code for now.   Will consult Palliative Care to assist with [place].    Darel Hong, AGACNP-BC [place] [facility] Pager: [phone]

## 2019-08-04 NOTE — Progress Notes (Signed)
[facility] for Electrolyte Monitoring and Replacement   Recent Labs: Potassium (mmol/L)  Date Value  07/21/2019 2.8 (L)   Calcium (mg/dL)  Date Value  07/20/2019 9.3   Sodium (mmol/L)  Date Value  07/29/2019 132 (L)     Assessment: 59 year old male admitted to ICU on mechanical ventilation. Pharmacy consulted for electrolyte management.  Goal of Therapy:  Electrolytes WNL  Plan:  Will give potassium 10 mEq IV for a total of 5 runs. Patient without PO access at this time. Will follow up all electrolytes with morning labs.  Tawnya Crook ,PharmD Clinical Pharmacist 07/21/2019 8:23 PM

## 2019-08-05 ENCOUNTER — Inpatient Hospital Stay
Admit: 2019-08-05 | Discharge: 2019-08-05 | Disposition: A | Discharge status: Home / Self Care | Payer: BC Managed Care – PPO | Attending: Pulmonary Disease | Admitting: Pulmonary Disease

## 2019-08-05 ENCOUNTER — Inpatient Hospital Stay: Payer: BC Managed Care – PPO

## 2019-08-05 DIAGNOSIS — Z7189 Other specified counseling: Secondary | ICD-10-CM

## 2019-08-05 DIAGNOSIS — Z515 Encounter for palliative care: Secondary | ICD-10-CM

## 2019-08-05 LAB — BASIC METABOLIC PANEL
Anion gap: 12 (ref 5–15)
BUN: 19 mg/dL (ref 8–23)
CO2: 24 mmol/L (ref 22–32)
Calcium: 8.5 mg/dL — ABNORMAL LOW (ref 8.9–10.3)
Chloride: 97 mmol/L — ABNORMAL LOW (ref 98–111)
Creatinine, Ser: 1.15 mg/dL (ref 0.61–1.24)
GFR calc Af Amer: >60 mL/min (ref >60)
GFR calc non Af Amer: >60 mL/min (ref >60)
Glucose, Bld: 187 mg/dL — ABNORMAL HIGH (ref 70–99)
Potassium: 3.9 mmol/L (ref 3.5–5.1)
Sodium: 133 mmol/L — ABNORMAL LOW (ref 135–145)

## 2019-08-05 LAB — MAGNESIUM: Magnesium: 1.6 mg/dL — ABNORMAL LOW (ref 1.7–2.4)

## 2019-08-05 LAB — HEMOGLOBIN A1C
Hgb A1c MFr Bld: 5.9 % — ABNORMAL HIGH (ref 4.8–5.6)
Mean Plasma Glucose: 122.63 mg/dL

## 2019-08-05 LAB — BLOOD GAS, ARTERIAL
Acid-Base Excess: 0 mmol/L (ref 0.0–2.0)
Acid-base deficit: 4.2 mmol/L — ABNORMAL HIGH (ref 0.0–2.0)
Bicarbonate: 25.4 mmol/L (ref 20.0–28.0)
Bicarbonate: 24.8 mmol/L (ref 20.0–28.0)
FIO2: 0.7
FIO2: 1
MECHVT: 400 mL
MECHVT: 400 mL
Mechanical Rate: 24
Mechanical Rate: 20
O2 Saturation: 99.1 %
O2 Saturation: 91.4 %
PEEP: 5 cmH2O
PEEP: 5 cmH2O
Patient temperature: 37
Patient temperature: 37
RATE: 20 resp/min
pCO2 arterial: 43 mmHg (ref 32.0–48.0)
pCO2 arterial: 62 mmHg — ABNORMAL HIGH (ref 32.0–48.0)
pH, Arterial: 7.38 (ref 7.350–7.450)
pH, Arterial: 7.21 — ABNORMAL LOW (ref 7.350–7.450)
pO2, Arterial: 140 mmHg — ABNORMAL HIGH (ref 83.0–108.0)
pO2, Arterial: 75 mmHg — ABNORMAL LOW (ref 83.0–108.0)

## 2019-08-05 LAB — LACTIC ACID, PLASMA: Lactic Acid, Venous: 1.8 mmol/L (ref 0.5–1.9)

## 2019-08-05 LAB — CBC
HCT: 40.9 % (ref 39.0–52.0)
Hemoglobin: 14.5 g/dL (ref 13.0–17.0)
MCH: 34.4 pg — ABNORMAL HIGH (ref 26.0–34.0)
MCHC: 35.5 g/dL (ref 30.0–36.0)
MCV: 96.9 fL (ref 80.0–100.0)
Platelets: 296 10*3/uL (ref 150–400)
RBC: 4.22 MIL/uL (ref 4.22–5.81)
RDW: 11.6 % (ref 11.5–15.5)
WBC: 20 10*3/uL — ABNORMAL HIGH (ref 4.0–10.5)
nRBC: 0 % (ref 0.0–0.2)

## 2019-08-05 LAB — GLUCOSE, CAPILLARY
Glucose-Capillary: 232 mg/dL — ABNORMAL HIGH (ref 70–99)
Glucose-Capillary: 163 mg/dL — ABNORMAL HIGH (ref 70–99)
Glucose-Capillary: 158 mg/dL — ABNORMAL HIGH (ref 70–99)
Glucose-Capillary: 254 mg/dL — ABNORMAL HIGH (ref 70–99)
Glucose-Capillary: 184 mg/dL — ABNORMAL HIGH (ref 70–99)

## 2019-08-05 LAB — PROCALCITONIN: Procalcitonin: 2.31 ng/mL

## 2019-08-05 LAB — CORTISOL: Cortisol, Plasma: 14.8 ug/dL

## 2019-08-05 LAB — COOXEMETRY PANEL
Carboxyhemoglobin: 1.4 % (ref 0.5–1.5)
Methemoglobin: 0.6 % (ref 0.0–1.5)
O2 Saturation: 75.6 %

## 2019-08-05 LAB — FIBRIN DERIVATIVES D-DIMER (ARMC ONLY): Fibrin derivatives D-dimer (ARMC): 2522.72 ng/mL (FEU) — ABNORMAL HIGH (ref 0.00–499.00)

## 2019-08-05 LAB — ECHOCARDIOGRAM COMPLETE
Height: 72 in
Weight: 2405.66 oz

## 2019-08-05 LAB — PHOSPHORUS: Phosphorus: 4.8 mg/dL — ABNORMAL HIGH (ref 2.5–4.6)

## 2019-08-05 LAB — HIV ANTIBODY (ROUTINE TESTING W REFLEX): HIV Screen 4th Generation wRfx: NONREACTIVE

## 2019-08-05 MED ORDER — SCOPOLAMINE 1 MG/3DAYS TD PT72
1.0000 | MEDICATED_PATCH | TRANSDERMAL | Status: DC
  Administered 2019-08-05: 1.5 mg via TRANSDERMAL
  Filled 2019-08-05: qty 1

## 2019-08-05 MED ORDER — MAGNESIUM SULFATE 2 GM/50ML IV SOLN
2.0000 g | Freq: Once | INTRAVENOUS | Status: AC
  Administered 2019-08-05: 2 g via INTRAVENOUS
  Filled 2019-08-05: qty 50

## 2019-08-05 MED ORDER — GLYCOPYRROLATE 0.2 MG/ML IJ SOLN
0.4000 mg | INTRAMUSCULAR | Status: DC | PRN
  Administered 2019-08-05 (×2): 0.4 mg via INTRAVENOUS
  Filled 2019-08-05 (×2): qty 2

## 2019-08-05 MED ORDER — LORAZEPAM 2 MG/ML IJ SOLN
2.0000 mg | INTRAMUSCULAR | Status: DC | PRN
  Administered 2019-08-05 (×3): 4 mg via INTRAVENOUS
  Filled 2019-08-05 (×3): qty 2

## 2019-08-05 MED ORDER — TRANEXAMIC ACID FOR INHALATION
500.0000 mg | Freq: Three times a day (TID) | RESPIRATORY_TRACT | Status: DC
  Administered 2019-08-05: 500 mg via RESPIRATORY_TRACT
  Filled 2019-08-05 (×4): qty 10

## 2019-08-05 MED ORDER — MIDAZOLAM 50MG/50ML (1MG/ML) PREMIX INFUSION
0.5000 mg/h | INTRAVENOUS | Status: DC
  Administered 2019-08-05: 1 mg/h via INTRAVENOUS
  Filled 2019-08-05: qty 50

## 2019-08-05 MED ORDER — LORAZEPAM 2 MG/ML IJ SOLN: 2.0000 mg | INTRAMUSCULAR | Status: DC | PRN

## 2019-08-05 MED ORDER — INSULIN ASPART 100 UNIT/ML ~~LOC~~ SOLN
0.0000 [IU] | SUBCUTANEOUS | Status: DC
  Administered 2019-08-05: 3 [IU] via SUBCUTANEOUS
  Filled 2019-08-05: qty 1

## 2019-08-05 MED ORDER — INSULIN ASPART 100 UNIT/ML ~~LOC~~ SOLN
0.0000 [IU] | SUBCUTANEOUS | Status: DC
  Administered 2019-08-05 (×2): 3 [IU] via SUBCUTANEOUS
  Administered 2019-08-05: 8 [IU] via SUBCUTANEOUS
  Filled 2019-08-05 (×3): qty 1

## 2019-08-05 NOTE — Consult Note (Signed)
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  Chief Complaint: Richard Mora is a 59 y.o. male with a history of COPD who was admitted from the ER with acuter respiratory failure requiring intubation.  HPI:  History is provided by the patient's chart and ICU nurse as the patient is intubated.  A call has been placed to his wife and a message left.  The patient has a 40 pack year smoking history.  He is followed by Dr Raul Del in the pulmonary clinic.  He was last seen on 07/21/2019 in clinic.  At that time, he noted persistent wheezing, increased shortness of breath and coughing.  He had stopped smoking for 5 days.  He was felt to have a COPD flare.  He was prescribed Anoro one puff/day, albuterol 2 puffs QID prn, Singulair q hs, Mucinex q 12 hours, and a prednisone pulse (20 mg/day x 5 days then 10 mg a day).  The patient has had a 31-monthhistory of shortness of breath which has progressively worsened over the past week.  [facility] with O2 sats ranging in the 70s to 80s.  He was placed on nonrebreather then intubated.  Chest x-ray revealed right upper lobe collapse.  Chest CT angiogram on 08/05/2019 revealed no pulmonary embolism.  There was a 3.7 x 2.7 cm soft tissue mass occluding the right mainstem bronchus c/w a central obstructing neoplasm.  There was pathologic pretracheal, right hilar, subcarinal and AP window adenopathy (largest precarinal node 1.9 cm and largest subcarinal node 2.3 cm).  There was complete atelectasis of the right lower lobe and patchy atelectasis of the right middle lobe.  There was nodular groundglass airspace disease in the right upper lobe c/w a postobstructive pneumonia.  There was a 6.5 x 5.8 cm cystic and  solid mass in the right adrenal gland.  There was a 1.7 cm cyst in the right lobe of the liver.  There was an indeterminate 3.3 x 3.7 cm mass in the upper pole of the right kidney.  There was a nonspecific sclerotic foci in the right T9 vertebral body and anterior T12 vertebral body.  Labs revealed a hematocrit of 44.7, hemoglobin 16.2, platelets 322,000, WBC 14,200.  Sodium was 132 with a potassium of 2.8.  Creatinine was 1.01.  He has had serial blood gases.  ABG this morning includes a pH of 7.38, PCO2 40, PO2 140 and bicarb 25.4..  He is currently intubated, sedated and paralyzed in the ICU.  From a respiratory perspective, his best position is with his right chest up.  Nursing notes a plan is for bronchoscopy.  CODE STATUS is DNR.   Past Medical History:  Diagnosis Date  . COPD (chronic obstructive pulmonary disease) ([place]   . Hypertension     Past Surgical History:  Procedure Laterality Date  . TONSILLECTOMY      No family history on file.  Social History:  reports that he has been smoking. He has never used smokeless tobacco. No history on file for alcohol and drug.  The patient lives in [place]  His wife's name is SIvin Booty([phone].  Allergies:  Allergies  Allergen Reactions  . Lisinopril Swelling    Swelling of the tongue     Medications Prior to Admission  Medication  Sig Dispense Refill  . albuterol (VENTOLIN HFA) 108 (90 Base) MCG/ACT inhaler Inhale 1-2 puffs into the lungs every 4 (four) hours as needed for shortness of breath or wheezing.    Jearl Klinefelter ELLIPTA 62.5-25 MCG/INH AEPB Inhale 1 puff into the lungs daily.    Marland Kitchen aspirin EC 81 MG tablet Take 81 mg by mouth daily.    . benzonatate (TESSALON) 100 MG capsule Take 100 mg by mouth 3 (three) times daily as needed for cough.    . hydrochlorothiazide (HYDRODIURIL) 12.5 MG tablet Take 12.5 mg by mouth daily.    . metoprolol succinate (TOPROL-XL) 50 MG 24 hr tablet Take 50 mg by mouth 2 (two) times daily.    .  montelukast (SINGULAIR) 10 MG tablet Take 10 mg by mouth at bedtime.    . Multiple Vitamin (MULTIVITAMIN WITH MINERALS) TABS tablet Take 1 tablet by mouth daily.    . predniSONE (DELTASONE) 10 MG tablet Take 10 mg by mouth daily.    Deborah Chalk ER 420 MG 24 hr capsule Take 420 mg by mouth daily.      Review of Systems: Unable to be obtained as patient is intubated and sedated.  Physical Exam:  Blood pressure (!) 88/67, pulse 68, temperature 97.8 F (36.6 C), temperature source Oral, resp. rate (!) 24, height 6' (1.829 m), weight 150 lb 5.7 oz (68.2 kg), SpO2 100 %.  GENERAL:  Acutely ill appearing gentleman lying on his left side in the ICU intubated and sedated. MENTAL STATUS: Sedated. HEAD:  Short brown hair.  Male pattern baldness.  Normocephalic, atraumatic, face symmetric, no Cushingoid features. EYES:  Brown eyes.  Pupils small and equal.  No conjunctivitis or scleral icterus. ENT:  Orally intubated.  RESPIRATORY:  Decreased breath sounds right chest.  Soft wheeze on expiration. CARDIOVASCULAR:  Regular rate and rhythm without murmur, rub or gallop. ABDOMEN:  Soft, non-tender, with active bowel sounds, and no hepatosplenomegaly.  No masses. SKIN:  No rashes, ulcers or lesions. EXTREMITIES: No edema, no skin discoloration or tenderness.  No palpable cords. LYMPH NODES: No palpable cervical, supraclavicular, axillary or inguinal adenopathy  NEUROLOGICAL: Sedated and paralyzed.   Results for orders placed or performed during the hospital encounter of 07/06/2019 (from the past 48 hour(s))  Basic metabolic panel     Status: Abnormal   Collection Time: 07/20/2019  3:19 PM  Result Value Ref Range   Sodium 132 (L) 135 - 145 mmol/L   Potassium 2.8 (L) 3.5 - 5.1 mmol/L   Chloride 93 (L) 98 - 111 mmol/L   CO2 25 22 - 32 mmol/L   Glucose, Bld 122 (H) 70 - 99 mg/dL    Comment: Glucose reference range applies only to samples taken after fasting for at least 8 hours.   BUN 12 8 - 23 mg/dL    Creatinine, Ser 1.01 0.61 - 1.24 mg/dL   Calcium 9.3 8.9 - 10.3 mg/dL   GFR calc non Af Amer >60 >60 mL/min   GFR calc Af Amer >60 >60 mL/min   Anion gap 14 5 - 15    Comment: Performed at [facility], [address] [place], [place] [address]  CBC     Status: Abnormal   Collection Time: 07/15/2019  3:19 PM  Result Value Ref Range   WBC 14.2 (H) 4.0 - 10.5 K/uL   RBC 4.73 4.22 - 5.81 MIL/uL   Hemoglobin 16.2 13.0 - 17.0 g/dL   HCT 44.7 39.0 - 52.0 %  MCV 94.5 80.0 - 100.0 fL   MCH 34.2 (H) 26.0 - 34.0 pg   MCHC 36.2 (H) 30.0 - 36.0 g/dL   RDW 11.4 (L) 11.5 - 15.5 %   Platelets 322 150 - 400 K/uL   nRBC 0.0 0.0 - 0.2 %    Comment: Performed at [facility], [address] [place], [place] [address]  Blood gas, arterial (WL & AP ONLY)     Status: Abnormal   Collection Time: 07/14/2019  3:54 PM  Result Value Ref Range   FIO2 1.00    pH, Arterial 7.55 (H) 7.350 - 7.450   pCO2 arterial 26 (L) 32.0 - 48.0 mmHg   pO2, Arterial 55 (L) 83.0 - 108.0 mmHg   Bicarbonate 22.7 20.0 - 28.0 mmol/L   Acid-Base Excess 1.6 0.0 - 2.0 mmol/L   O2 Saturation 92.2 %   Patient temperature 37.0    Collection site RIGHT RADIAL    Sample type ARTERIAL DRAW    Allens test (pass/fail) PASS PASS    Comment: Performed at [facility], [address], [place], [place] [address]  Respiratory Panel by RT PCR (Flu A&B, Covid) - Nasopharyngeal Swab     Status: None   Collection Time: 07/21/2019  4:29 PM   Specimen: Nasopharyngeal Swab  Result Value Ref Range   SARS Coronavirus 2 by RT PCR NEGATIVE NEGATIVE    Comment: (NOTE) SARS-CoV-2 target nucleic acids are NOT DETECTED. The SARS-CoV-2 RNA is generally detectable in upper respiratoy specimens during the acute phase of infection. The lowest concentration of SARS-CoV-2 viral copies this assay can detect is 131 copies/mL. A negative result does not preclude SARS-Cov-2 infection and should not be used as the sole basis for  treatment or other patient management decisions. A negative result may occur with  improper specimen collection/handling, submission of specimen other than nasopharyngeal swab, presence of viral mutation(s) within the areas targeted by this assay, and inadequate number of viral copies (<131 copies/mL). A negative result must be combined with clinical observations, patient history, and epidemiological information. The expected result is Negative. Fact Sheet for Patients:  PinkCheek.be Fact Sheet for Healthcare Providers:  GravelBags.it This test is not yet ap proved or cleared by the [place] FDA and  has been authorized for detection and/or diagnosis of SARS-CoV-2 by FDA under an Emergency Use Authorization (EUA). This EUA will remain  in effect (meaning this test can be used) for the duration of the COVID-19 declaration under Section 564(b)(1) of the Act, 21 U.S.C. section 360bbb-3(b)(1), unless the authorization is terminated or revoked sooner.    Influenza A by PCR NEGATIVE NEGATIVE   Influenza B by PCR NEGATIVE NEGATIVE    Comment: (NOTE) The Xpert Xpress SARS-CoV-2/FLU/RSV assay is intended as an aid in  the diagnosis of influenza from Nasopharyngeal swab specimens and  should not be used as a sole basis for treatment. Nasal washings and  aspirates are unacceptable for Xpert Xpress SARS-CoV-2/FLU/RSV  testing. Fact Sheet for Patients: PinkCheek.be Fact Sheet for Healthcare Providers: GravelBags.it This test is not yet approved or cleared by the [place] FDA and  has been authorized for detection and/or diagnosis of SARS-CoV-2 by  FDA under an Emergency Use Authorization (EUA). This EUA will remain  in effect (meaning this test can be used) for the duration of the  Covid-19 declaration under Section 564(b)(1) of the Act, 21  U.S.C. section  360bbb-3(b)(1), unless the authorization is  terminated or revoked. Performed at [facility], [address] [place], [place] [address]   Culture, blood (routine x 2)     Status: None (Preliminary result)   Collection Time: 08/05/2019  6:15 PM   Specimen: BLOOD  Result Value Ref Range   Specimen Description BLOOD BLOOD RIGHT FOREARM    Special Requests      BOTTLES DRAWN AEROBIC AND ANAEROBIC Blood Culture adequate volume   Culture      NO GROWTH < 12 HOURS Performed at [facility], [address], [place], [place] [address]    Report Status PENDING   Culture, blood (routine x 2)     Status: None (Preliminary result)   Collection Time: 07/24/2019  6:20 PM   Specimen: BLOOD  Result Value Ref Range   Specimen Description BLOOD BLOOD LEFT FOREARM    Special Requests      BOTTLES DRAWN AEROBIC AND ANAEROBIC Blood Culture adequate volume   Culture  Setup Time      GRAM POSITIVE RODS AEROBIC BOTTLE ONLY CRITICAL RESULT CALLED TO, READ BACK BY AND VERIFIED WITH: Hart Robinsons [facility] 3267 08/05/19 HNM Performed at [facility], [address] [place], [place] [address]    Culture GRAM POSITIVE RODS    Report Status PENDING   Blood gas, arterial     Status: Abnormal   Collection Time: 07/28/2019  7:47 PM  Result Value Ref Range   FIO2 1.00    Delivery systems VENTILATOR    Mode ASSIST CONTROL    VT 450 mL   Peep/cpap 5.0 cm H20   pH, Arterial 7.33 (L) 7.350 - 7.450   pCO2 arterial 48 32.0 - 48.0 mmHg   pO2, Arterial 56 (L) 83.0 - 108.0 mmHg   Bicarbonate 25.3 20.0 - 28.0 mmol/L   Acid-base deficit 1.1 0.0 - 2.0 mmol/L   O2 Saturation 86.4 %   Patient temperature 37.0    Collection site LEFT RADIAL    Sample type ARTERIAL DRAW    Allens test (pass/fail) PASS PASS   Mechanical Rate 16     Comment: Performed at [facility], [address] [place], [place] [address]  Glucose, capillary     Status: Abnormal   Collection Time: 08/03/2019  8:18  PM  Result Value Ref Range   Glucose-Capillary 232 (H) 70 - 99 mg/dL    Comment: Glucose reference range applies only to samples taken after fasting for at least 8 hours.  MRSA PCR Screening     Status: None   Collection Time: 08/02/2019  8:24 PM   Specimen: Nasopharyngeal  Result Value Ref Range   MRSA by PCR NEGATIVE NEGATIVE    Comment:        The GeneXpert MRSA Assay (FDA approved for NASAL specimens only), is one component of a comprehensive MRSA colonization surveillance program. It is not intended to diagnose MRSA infection nor to guide or monitor treatment for MRSA infections. Performed at [facility], [address] [place], [place] [address]   Creatinine, serum     Status: None   Collection Time: 07/17/2019  8:42 PM  Result Value Ref Range   Creatinine, Ser 1.21 0.61 - 1.24 mg/dL   GFR calc non Af Amer >60 >60 mL/min   GFR calc Af Amer >60 >60 mL/min    Comment: Performed at [facility], [address] [place],  [address]  Lactic acid, plasma     Status: Abnormal   Collection Time: 08/05/2019  8:42 PM  Result Value  Ref Range   Lactic Acid, Venous 3.4 (HH) 0.5 - 1.9 mmol/L    Comment: CRITICAL RESULT CALLED TO, READ BACK BY AND VERIFIED WITH JOSH WILLIAMSON AT  2133 ON 07/11/2019 RWW Performed at [facility], [address] [place], [place] [address]   Procalcitonin     Status: None   Collection Time: 07/10/2019  8:42 PM  Result Value Ref Range   Procalcitonin 1.83 ng/mL    Comment:        Interpretation: PCT > 0.5 ng/mL and <= 2 ng/mL: Systemic infection (sepsis) is possible, but other conditions are known to elevate PCT as well. (NOTE)       Sepsis PCT Algorithm           Lower Respiratory Tract                                      Infection PCT Algorithm    ----------------------------     ----------------------------         PCT < 0.25 ng/mL                PCT < 0.10 ng/mL         Strongly encourage             Strongly  discourage   discontinuation of antibiotics    initiation of antibiotics    ----------------------------     -----------------------------       PCT 0.25 - 0.50 ng/mL            PCT 0.10 - 0.25 ng/mL               OR       >80% decrease in PCT            Discourage initiation of                                            antibiotics      Encourage discontinuation           of antibiotics    ----------------------------     -----------------------------         PCT >= 0.50 ng/mL              PCT 0.26 - 0.50 ng/mL                AND       <80% decrease in PCT             Encourage initiation of                                             antibiotics       Encourage continuation           of antibiotics    ----------------------------     -----------------------------        PCT >= 0.50 ng/mL                  PCT > 0.50 ng/mL               AND         increase in PCT  Strongly encourage                                      initiation of antibiotics    Strongly encourage escalation           of antibiotics                                     -----------------------------                                           PCT <= 0.25 ng/mL                                                 OR                                        > 80% decrease in PCT                                     Discontinue / Do not initiate                                             antibiotics Performed at [facility], [address] [place], [place] [address]   Cortisol     Status: None   Collection Time: 07/09/2019  8:42 PM  Result Value Ref Range   Cortisol, Plasma 14.8 ug/dL    Comment: (NOTE) AM    6.7 - 22.6 ug/dL PM   <10.0       ug/dL Performed at [facility] [address] [place], [place] [address]   Brain natriuretic peptide     Status: Abnormal   Collection Time: 07/13/2019  8:42 PM  Result Value Ref Range   B Natriuretic Peptide 146.0 (H) 0.0 - 100.0 pg/mL     Comment: Performed at [facility], [address] [place], [place] [address]  CBC WITH DIFFERENTIAL     Status: Abnormal   Collection Time: 07/09/2019  8:42 PM  Result Value Ref Range   WBC 17.8 (H) 4.0 - 10.5 K/uL   RBC 4.72 4.22 - 5.81 MIL/uL   Hemoglobin 16.1 13.0 - 17.0 g/dL   HCT 45.3 39.0 - 52.0 %   MCV 96.0 80.0 - 100.0 fL   MCH 34.1 (H) 26.0 - 34.0 pg   MCHC 35.5 30.0 - 36.0 g/dL   RDW 11.6 11.5 - 15.5 %   Platelets 354 150 - 400 K/uL   nRBC 0.0 0.0 - 0.2 %   Neutrophils Relative % 93 %   Neutro Abs 16.5 (H) 1.7 - 7.7 K/uL   Lymphocytes Relative 3 %   Lymphs Abs 0.5 (L) 0.7 - 4.0 K/uL   Monocytes Relative 3 %   Monocytes Absolute 0.6  0.1 - 1.0 K/uL   Eosinophils Relative 0 %   Eosinophils Absolute 0.0 0.0 - 0.5 K/uL   Basophils Relative 0 %   Basophils Absolute 0.0 0.0 - 0.1 K/uL   Immature Granulocytes 1 %   Abs Immature Granulocytes 0.19 (H) 0.00 - 0.07 K/uL    Comment: Performed at [facility], [address] [place], [place] [address]  Protime-INR     Status: None   Collection Time: 08/03/2019  8:42 PM  Result Value Ref Range   Prothrombin Time 12.1 11.4 - 15.2 seconds   INR 0.9 0.8 - 1.2    Comment: (NOTE) INR goal varies based on device and disease states. Performed at [facility], [address] [place], [place] [address]   Fibrin derivatives D-[facility] only)     Status: Abnormal   Collection Time: 07/22/2019  8:42 PM  Result Value Ref Range   Fibrin derivatives D-dimer ([facility]) 3,251.16 (H) 0.00 - 499.00 ng/mL (FEU)    Comment: (NOTE) <> Exclusion of Venous Thromboembolism (VTE) - OUTPATIENT ONLY   (Emergency Department or [place])   0-499 ng/ml (FEU): With a low to intermediate pretest probability                      for VTE this test result excludes the diagnosis                      of VTE.   >499 ng/ml (FEU) : VTE not excluded; additional work up for VTE is                      required. <> Testing on Inpatients and  Evaluation of Disseminated Intravascular   Coagulation (DIC) Reference Range:   0-499 ng/ml (FEU) Performed at [facility], [address] [place], [place] [address]   Magnesium     Status: None   Collection Time: 07/17/2019  8:44 PM  Result Value Ref Range   Magnesium 1.7 1.7 - 2.4 mg/dL    Comment: Performed at [facility], [address] [place], [place] [address]  Phosphorus     Status: Abnormal   Collection Time: 07/06/2019  8:44 PM  Result Value Ref Range   Phosphorus 4.8 (H) 2.5 - 4.6 mg/dL    Comment: Performed at [facility], [address], [place], [place] [address]  .Cooxemetry Panel (carboxy, met, total hgb, O2 sat)     Status: None (Preliminary result)   Collection Time: 08/01/2019  9:24 PM  Result Value Ref Range   O2 Saturation 75.6 %   Carboxyhemoglobin 1.4 0.5 - 1.5 %   Methemoglobin 0.6 0.0 - 1.5 %    Comment: Performed at [facility], [address] [place], [place] [address]   Total oxygen content PENDING 15.0 - 23.0 mL/dL  Blood gas, arterial     Status: Abnormal   Collection Time: 07/15/2019 10:00 PM  Result Value Ref Range   FIO2 1.00    Delivery systems VENTILATOR    Mode PRESSURE REGULATED VOLUME CONTROL    VT 400 mL   LHR 20 resp/min   Peep/cpap 10.0 cm H20   pH, Arterial 7.27 (L) 7.350 - 7.450   pCO2 arterial 58 (H) 32.0 - 48.0 mmHg   pO2, Arterial 44 (L) 83.0 - 108.0 mmHg   Bicarbonate 26.6 20.0 - 28.0 mmol/L   Acid-base deficit 1.4 0.0 - 2.0 mmol/L   O2 Saturation  72.3 %   Patient temperature 37.0    Collection site RIGHT RADIAL    Sample type ARTERIAL DRAW    Allens test (pass/fail) PASS PASS    Comment: Performed at [facility], [address] [place], [place] [address]  Lactic acid, plasma     Status: None   Collection Time: 07/24/2019 11:45 PM  Result Value Ref Range   Lactic Acid, Venous 1.8 0.5 - 1.9 mmol/L    Comment: Performed at [facility], [address]  [place], [place] [address]  Basic metabolic panel     Status: Abnormal   Collection Time: 08/05/19  3:43 AM  Result Value Ref Range   Sodium 133 (L) 135 - 145 mmol/L   Potassium 3.9 3.5 - 5.1 mmol/L   Chloride 97 (L) 98 - 111 mmol/L   CO2 24 22 - 32 mmol/L   Glucose, Bld 187 (H) 70 - 99 mg/dL    Comment: Glucose reference range applies only to samples taken after fasting for at least 8 hours.   BUN 19 8 - 23 mg/dL   Creatinine, Ser 1.15 0.61 - 1.24 mg/dL   Calcium 8.5 (L) 8.9 - 10.3 mg/dL   GFR calc non Af Amer >60 >60 mL/min   GFR calc Af Amer >60 >60 mL/min   Anion gap 12 5 - 15    Comment: Performed at [facility], [address] [place], [place] [address]  CBC     Status: Abnormal   Collection Time: 08/05/19  3:43 AM  Result Value Ref Range   WBC 20.0 (H) 4.0 - 10.5 K/uL   RBC 4.22 4.22 - 5.81 MIL/uL   Hemoglobin 14.5 13.0 - 17.0 g/dL   HCT 40.9 39.0 - 52.0 %   MCV 96.9 80.0 - 100.0 fL   MCH 34.4 (H) 26.0 - 34.0 pg   MCHC 35.5 30.0 - 36.0 g/dL   RDW 11.6 11.5 - 15.5 %   Platelets 296 150 - 400 K/uL   nRBC 0.0 0.0 - 0.2 %    Comment: Performed at [facility], [address], [place], [place] [address]  Magnesium     Status: Abnormal   Collection Time: 08/05/19  3:43 AM  Result Value Ref Range   Magnesium 1.6 (L) 1.7 - 2.4 mg/dL    Comment: Performed at [facility], [address] [place], [place] [address]  Procalcitonin     Status: None   Collection Time: 08/05/19  3:43 AM  Result Value Ref Range   Procalcitonin 2.31 ng/mL    Comment:        Interpretation: PCT > 2 ng/mL: Systemic infection (sepsis) is likely, unless other causes are known. (NOTE)       Sepsis PCT Algorithm           Lower Respiratory Tract                                      Infection PCT Algorithm    ----------------------------     ----------------------------         PCT < 0.25 ng/mL                PCT < 0.10 ng/mL         Strongly encourage             Strongly  discourage   discontinuation of antibiotics  initiation of antibiotics    ----------------------------     -----------------------------       PCT 0.25 - 0.50 ng/mL            PCT 0.10 - 0.25 ng/mL               OR       >80% decrease in PCT            Discourage initiation of                                            antibiotics      Encourage discontinuation           of antibiotics    ----------------------------     -----------------------------         PCT >= 0.50 ng/mL              PCT 0.26 - 0.50 ng/mL               AND       <80% decrease in PCT              Encourage initiation of                                             antibiotics       Encourage continuation           of antibiotics    ----------------------------     -----------------------------        PCT >= 0.50 ng/mL                  PCT > 0.50 ng/mL               AND         increase in PCT                  Strongly encourage                                      initiation of antibiotics    Strongly encourage escalation           of antibiotics                                     -----------------------------                                           PCT <= 0.25 ng/mL                                                 OR                                        >  80% decrease in PCT                                     Discontinue / Do not initiate                                             antibiotics Performed at [facility], [address] [place], [place] [address]   Phosphorus     Status: Abnormal   Collection Time: 08/05/19  3:43 AM  Result Value Ref Range   Phosphorus 4.8 (H) 2.5 - 4.6 mg/dL    Comment: Performed at [facility], [address] [place], [place] [address]  Fibrin derivatives [facility] only)     Status: Abnormal   Collection Time: 08/05/19  3:43 AM  Result Value Ref Range   Fibrin derivatives D-dimer ([facility]) 2,522.72 (H) 0.00 - 499.00 ng/mL (FEU)    Comment:  (NOTE) <> Exclusion of Venous Thromboembolism (VTE) - OUTPATIENT ONLY   (Emergency Department or [place])   0-499 ng/ml (FEU): With a low to intermediate pretest probability                      for VTE this test result excludes the diagnosis                      of VTE.   >499 ng/ml (FEU) : VTE not excluded; additional work up for VTE is                      required. <> Testing on Inpatients and Evaluation of Disseminated Intravascular   Coagulation (DIC) Reference Range:   0-499 ng/ml (FEU) Performed at [facility], [address] [place], [place] [address]   Procalcitonin     Status: None   Collection Time: 08/05/19  5:00 AM  Result Value Ref Range   Procalcitonin 1.84 ng/mL    Comment:        Interpretation: PCT > 0.5 ng/mL and <= 2 ng/mL: Systemic infection (sepsis) is possible, but other conditions are known to elevate PCT as well. (NOTE)       Sepsis PCT Algorithm           Lower Respiratory Tract                                      Infection PCT Algorithm    ----------------------------     ----------------------------         PCT < 0.25 ng/mL                PCT < 0.10 ng/mL         Strongly encourage             Strongly discourage   discontinuation of antibiotics    initiation of antibiotics    ----------------------------     -----------------------------       PCT 0.25 - 0.50 ng/mL            PCT 0.10 - 0.25 ng/mL               OR       >80% decrease  in PCT            Discourage initiation of                                            antibiotics      Encourage discontinuation           of antibiotics    ----------------------------     -----------------------------         PCT >= 0.50 ng/mL              PCT 0.26 - 0.50 ng/mL                AND       <80% decrease in PCT             Encourage initiation of                                             antibiotics       Encourage continuation           of antibiotics    ----------------------------      -----------------------------        PCT >= 0.50 ng/mL                  PCT > 0.50 ng/mL               AND         increase in PCT                  Strongly encourage                                      initiation of antibiotics    Strongly encourage escalation           of antibiotics                                     -----------------------------                                           PCT <= 0.25 ng/mL                                                 OR                                        > 80% decrease in PCT                                     Discontinue / Do not initiate  antibiotics Performed at [facility], [address] [place], [place] [address]   Blood gas, arterial     Status: Abnormal   Collection Time: 08/05/19  6:02 AM  Result Value Ref Range   FIO2 1.00    Delivery systems VENTILATOR    Mode PRESSURE REGULATED VOLUME CONTROL    VT 400 mL   Peep/cpap 5.0 cm H20   pH, Arterial 7.38 7.350 - 7.450   pCO2 arterial 43 32.0 - 48.0 mmHg   pO2, Arterial 140 (H) 83.0 - 108.0 mmHg   Bicarbonate 25.4 20.0 - 28.0 mmol/L   Acid-Base Excess 0.0 0.0 - 2.0 mmol/L   O2 Saturation 99.1 %   Patient temperature 37.0    Collection site LEFT RADIAL    Sample type ARTERIAL DRAW    Allens test (pass/fail) PASS PASS   Mechanical Rate 24     Comment: Performed at [facility], [address] [place], [place] [address]  Glucose, capillary     Status: Abnormal   Collection Time: 08/05/19  6:08 AM  Result Value Ref Range   Glucose-Capillary 163 (H) 70 - 99 mg/dL    Comment: Glucose reference range applies only to samples taken after fasting for at least 8 hours.  Glucose, capillary     Status: Abnormal   Collection Time: 08/05/19  7:20 AM  Result Value Ref Range   Glucose-Capillary 158 (H) 70 - 99 mg/dL    Comment: Glucose reference range applies only to samples taken after fasting for at least 8 hours.   DG  Abd 1 View  Result Date: 08/05/2019 CLINICAL DATA:  NG tube placement. EXAM: ABDOMEN - 1 VIEW COMPARISON:  None. FINDINGS: The NG tube is coursing down the esophagus and into the stomach. Tip is along the lateral body wall. The upper abdominal bowel gas pattern is unremarkable. IMPRESSION: The NG tube is in the stomach. Electronically Signed   By: Marijo Sanes M.D.   On: 08/05/2019 05:42   CT Angio Chest PE W and/or Wo Contrast  Result Date: 08/02/2019 CLINICAL DATA:  Short of breath, hypoxic, respiratory failure, COPD EXAM: CT ANGIOGRAPHY CHEST WITH CONTRAST TECHNIQUE: Multidetector CT imaging of the chest was performed using the standard protocol during bolus administration of intravenous contrast. Multiplanar CT image reconstructions and MIPs were obtained to evaluate the vascular anatomy. CONTRAST:  12m OMNIPAQUE IOHEXOL 350 MG/ML SOLN COMPARISON:  08/03/2019, 04/04/2019 FINDINGS: Cardiovascular: This is a technically adequate evaluation of the pulmonary vasculature. There are no filling defects or pulmonary emboli. The heart is unremarkable without pericardial effusion. Thoracic aorta is normal in caliber without aneurysm or dissection. Mild atherosclerosis of the coronary vessels. Mediastinum/Nodes: There is obstructing soft tissue mass occluding the right mainstem bronchus, measuring 3.7 x 2.7 cm on axial image 41, and extending approximately 3.2 cm in craniocaudal direction coronal image 46. Findings are compatible with central obstructing neoplasm. Pathologic adenopathy is seen within the pretracheal, right hilar, subcarinal, and AP window distributions. Largest lymph node in the precarinal region measures 19 mm and in the subcarinal region measures 23 mm in short axis. Enteric catheter extends into the gastric lumen. Thyroid is unremarkable. Endotracheal tube is identified, tip well above carina. Lungs/Pleura: Soft tissue mass obstructing the right mainstem bronchus is noted. There is complete  atelectasis of the right lower lobe. Patchy atelectasis of the right middle lobe is noted. Nodular ground-glass airspace disease within the right upper lobe consistent with postobstructive pneumonia. There is background emphysema. The left chest  is clear. No pleural effusion or pneumothorax. 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 1.7 cm cyst within the right lobe liver is noted. Indeterminate mass arises from the upper pole right kidney measuring 3.3 x 3.7 cm, mean Hounsfield attenuation of 21. Other bilateral renal cysts are noted. Musculoskeletal: There are no acute displaced fractures. Nonspecific sclerotic foci right aspect T9 vertebral body and anterior aspect T12 vertebral body. No destructive lesions. Reconstructed images demonstrate no additional findings. Review of the MIP images confirms the above findings. IMPRESSION: 1. Soft tissue mass obstructing the right upper lobe bronchus compatible with malignancy. This is amenable to endobronchial sampling. 2. No evidence of pulmonary embolus. 3. Metastatic adenopathy throughout the mediastinum and right hilum. 4. Cystic and solid right adrenal mass highly concerning for metastatic disease. 5. Indeterminate lesion upper pole right kidney may require further evaluation with MRI or ultrasound. 6. Extensive postobstructive changes within the right lung, with complete atelectasis of the right lower lobe and partial right middle lobe atelectasis. Likely postobstructive pneumonia within the right upper lobe. 7. Support devices as above. Electronically Signed   By: Randa Ngo M.D.   On: 08/03/2019 18:08   DG Chest Port 1 View  Result Date: 08/05/2019 CLINICAL DATA:  Respiratory failure. Lung mass. EXAM: PORTABLE CHEST 1 VIEW COMPARISON:  07/10/2019 FINDINGS: The endotracheal tube and NG tubes are stable. Right hilar lesion with obstructive atelectasis and significant  loss of volume in the right lung. The left lung appears relatively clear. External pacer paddles are noted. No pneumothorax. The bony thorax is intact. IMPRESSION: 1. Stable support apparatus. 2. Persistent right hilar lesion with obstructive atelectasis and significant loss of volume in the right lung. Electronically Signed   By: Marijo Sanes M.D.   On: 08/05/2019 05:42   DG Chest Portable 1 View  Result Date: 07/29/2019 CLINICAL DATA:  Post intubation EXAM: PORTABLE CHEST 1 VIEW COMPARISON:  07/13/2019, 04/04/2019 FINDINGS: Interval intubation, tip of the endotracheal tube is about 4.2 cm superior to carina. Placement of esophageal tube, the tip projects over the GE junction region. Left lung grossly clear. Volume loss right hemithorax with shift to the right. Increasing opacity at the right base. Stable cardiomediastinal silhouette. IMPRESSION: 1. Endotracheal tube tip about 4.2 cm superior to carina. Esophageal tube tip overlies GE junction region, consider further advancement for more optimal positioning 2. Volume loss within the right thorax with shift of contents to the right. Increasing airspace disease at the right base. Further evaluation with chest CT previously suggest. Electronically Signed   By: Donavan Foil M.D.   On: 07/30/2019 17:20   DG Chest Portable 1 View  Result Date: 07/11/2019 CLINICAL DATA:  Worsening shortness of breath, hypertension, tobacco abuse EXAM: PORTABLE CHEST 1 VIEW COMPARISON:  04/04/2019 FINDINGS: Single frontal view of the chest demonstrates new volume loss right hemithorax. There is elevation of the right mid diaphragm, mediastinal shift to the right, increased density in the right paratracheal region. This could reflect right upper lobe collapse. Patchy consolidation at the right lung base could reflect superimposed airspace disease. Left chest is clear. No acute bony abnormalities. IMPRESSION: 1. Right-sided volume loss, which may reflect right upper lobe collapse.  2. Patchy consolidation at the right lung base which may be hypoventilatory or airspace disease. 3. Further evaluation with CT chest with contrast may be useful. Electronically Signed   By: Randa Ngo M.D.   On: 07/11/2019  15:52    Assessment:  The patient is a 59 y.o. gentleman with probable metastatic lung cancer.  He presented with progressive shortness of breath requring intubation and mechanical ventilation.  Chest CT angiogram on 07/10/2019 revealed no pulmonary embolism.  There was a 3.7 x 2.7 cm soft tissue mass occluding the right mainstem bronchus c/w a central obstructing neoplasm.  There was pathologic pretracheal, right hilar, subcarinal and AP window adenopathy.  There was complete atelectasis of the right lower lobe and patchy atelectasis of the right middle lobe.  There was nodular groundglass airspace disease in the right upper lobe c/w a postobstructive pneumonia.  There was a 6.5 x 5.8 cm cystic and solid mass in the right adrenal gland. There was an indeterminate 3.3 x 3.7 cm mass in the upper pole of the right kidney.  There was a nonspecific sclerotic foci in the right T9 vertebral body and anterior T12 vertebral body.  He is currently intubated, sedated and paralyzed in the ICU.  CODE STATUS is DNR.  Plan:   1.   Probable metastatic lung cancer  Patient clinically stable in ICU on ventilator support.  Images personally reviewed.  Per nursing, plan is for bronchoscopy to assess central obstructing lesion.  Anticipate tissue for pathologic diagnosis and treatment options.  Unclear if patient would be amendable to laser bronchoscopy or stent placement.  Plan to consult radiation oncology regarding other treatment options.  Agree with palliative care consult. 2.   Post obstructive pneumonia  Patient currently on Cefepime and vancomycin.  Blood cultures + gram positive rods (await culture and sensitivity). 3.   Elevated D dimers  No evidence of PE on CT angiogram.  Bilateral  lower extremity duplex today. 4.   Plan to reach out to patient's wife again today.   Thank you for allowing me to participate in Jaxzen Vanhorn 's care.  I will follow him closely with you while hospitalized and after discharge in the outpatient department.   Lequita Asal, MD  08/05/2019, 8:22 AM

## 2019-08-05 NOTE — Progress Notes (Signed)
*  PRELIMINARY RESULTS* Echocardiogram 2D Echocardiogram has been performed.  Richard Mora 08/05/2019, 10:21 AM

## 2019-08-05 NOTE — Progress Notes (Signed)
Richard Mora was visiting a patient admitted to [facility] admitted on 03/30-04/01. Please excuse her absence during this time of visitation.  Treatment Team:  Attending Provider: Ottie Glazier, MD

## 2019-08-05 NOTE — Progress Notes (Signed)
Wife asking questions about process of transitioning to comfort care as discussions were started with palliative care team. Family of patient has arrived and has had time to visit. Wife would like to transition to comfort care at this time. All other family members agree with comfort measures as well. Thoroughly explained the transition process with them.  Called and spoke to Dr. Lanney Gins. Dr. Loni Muse agrees comfort care is appropriate for this patient. Orders given to make transition. Will leave on continuous Fentanyl infusion for transition.

## 2019-08-05 NOTE — Progress Notes (Signed)
PHARMACY - PHYSICIAN COMMUNICATION CRITICAL VALUE ALERT - BLOOD CULTURE IDENTIFICATION (BCID)  Richard Mora is an 59 y.o. male who presented to [facility] on 07/21/2019 with a chief complaint of SOB   Assessment:  Lab reports 1 of 4 bottles + for GPR  Name of physician (or Provider) ContactedRachael Fee, NP  Current antibiotics: Cefepime and Vancomycin  Changes to prescribed antibiotics recommended:  Patient is on recommended antibiotics - No changes needed  No results found for this or any previous visit.  Hart Robinsons A 08/05/2019  5:38 AM

## 2019-08-05 NOTE — Progress Notes (Signed)
Patient extubated to Room Air per Order and family request.  RN and family at bedside.

## 2019-08-05 NOTE — Progress Notes (Signed)
Name: Richard Mora MRN: [id number] DOB: October 24, 1960    ADMISSION DATE:  07/19/2019 CONSULTATION DATE:  07/17/2019  REFERRING MD :  Dr. Archie Balboa  CHIEF COMPLAINT:  Shortness of Breath  BRIEF PATIENT DESCRIPTION:  59 y.o. 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  Oncology has been consulted.   SIGNIFICANT EVENTS  3/30: Presented to ED. Significant hypoxia requiring intubation in ED. 3/30: Significant Hypoxia upon arrival to ICU; Tidal Volume decreased to 400 ml and RR increased to 20 bpm with improvement in O2 sats 3/31 - patient with high amount of bright red blood per ETT, elevated settings on MV, have obtained tracheal aspirate for cytology.  Will consider bronchoscopy when patient is more stable as he is critically ill requiring paralytic to reach spO2 compatible with life.  DNR with possible comfort care post [place] with family.   STUDIES:  3/30: CXR>>1. Endotracheal tube tip about 4.2 cm superior to carina. Esophageal tube tip overlies GE junction region, consider further advancement for more optimal positioning 2. Volume loss within the right thorax with shift of contents to the right. Increasing airspace disease at the right base. Further evaluation with chest CT previously suggest. 3/30: CTA Chest>> 1. Soft tissue mass obstructing the right upper lobe bronchus compatible with malignancy. This is amenable to endobronchial sampling. 2. No evidence of pulmonary embolus. 3. Metastatic adenopathy throughout the mediastinum and right hilum. 4. Cystic and solid right adrenal mass highly concerning for metastatic disease. 5. Indeterminate lesion upper pole right kidney may require further evaluation with MRI or ultrasound. 6. Extensive postobstructive changes within the  right lung, with complete atelectasis of the right lower lobe and partial right middle lobe atelectasis. Likely postobstructive pneumonia within the right upper lobe. 7. Support devices as above.  CULTURES: SARS-CoV-2 PCR 3/30>> negative Influenza PCR 3/30>> negative Blood culture 3/30>> Tracheal aspirate 3/30>> Strep pneumo urinary antigen 3/30>> Legionella urinary antigen 3/30>>  ANTIBIOTICS: Cefepime 3/30>> Vancomycin 3/30>>  HISTORY OF PRESENT ILLNESS:   Mr. Richard Mora is a 59 year old male with a past medical history notable for COPD, hypertension, former smoker (40-year history of smoking).  He presents to [facility] ED on 07/06/2019 with complaints of shortness of breath.  He is currently intubated and sedated, no family is present, therefore history is obtained from ED and nursing notes.  Per notes he reported an approximate 20-monthhistory of shortness of breath, which is progressively worsened over the past week.  He was seen by his Pulmonologist Dr. FRaul Delon 07/21/2019 for COPD exacerbation, and was given inhalers, nebulizers, and prednisone, but did not obtain any significant relief.  He denied chest pain or cough.  Upon presentation to ED he was noted to be severely hypoxic, which he was placed on non-rebreather.  Despite nonrebreather he remains significantly hypoxic with O2 sats ranging from the 70s to 80s, which he was subsequently intubated.  Chest x-ray showed right upper lobe collapse.  CTA Chest was obtained which revealed an obstructing mass of the right mainstem bronchus consistent with central obstructing neoplasm, along with groundglass airspace disease of the right upper lobe consistent with postobstructive pneumonia, along with emphysema.  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  Lab work was concerning for sodium 132, potassium 2.8, WBC 14.2, procalcitonin 1.83, lactic acid 3.4, BNP 146, D-dimer 3251.  His  SARS-CoV-2 PCR  is negative, influenza PCR is negative.  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  Oncology has been consulted.  After arrival to ICU he remained severely hypoxic despite 100% FiO2 on the vent and 10 of PEEP.  He required Nimbex drip and positioning on left side (right lung up) with noted improvement and O2 saturations.  Given his critical illness and poor prognosis his wife decided to make him DNR/DNI.  Palliative care has been consulted with further assistance in establishing goals of care.    PAST MEDICAL HISTORY :   has a past medical history of COPD (chronic obstructive pulmonary disease) ([place]) and Hypertension.  has a past surgical history that includes Tonsillectomy. Prior to Admission medications   Medication Sig Start Date End Date Taking? Authorizing Provider  albuterol (VENTOLIN HFA) 108 (90 Base) MCG/ACT inhaler Inhale 1-2 puffs into the lungs every 4 (four) hours as needed for shortness of breath or wheezing. 08/03/19  Yes [provider]  ANORO ELLIPTA 62.5-25 MCG/INH AEPB Inhale 1 puff into the lungs daily. 07/17/19  Yes [provider]  aspirin EC 81 MG tablet Take 81 mg by mouth daily.   Yes [provider]  benzonatate (TESSALON) 100 MG capsule Take 100 mg by mouth 3 (three) times daily as needed for cough. 07/20/19  Yes [provider]  hydrochlorothiazide (HYDRODIURIL) 12.5 MG tablet Take 12.5 mg by mouth daily. 08/01/19  Yes [provider]  metoprolol succinate (TOPROL-XL) 50 MG 24 hr tablet Take 50 mg by mouth 2 (two) times daily. 07/31/19  Yes [provider]  montelukast (SINGULAIR) 10 MG tablet Take 10 mg by mouth at bedtime. 07/21/19  Yes [provider]  Multiple Vitamin (MULTIVITAMIN WITH MINERALS) TABS tablet Take 1 tablet by mouth daily.   Yes [provider]  predniSONE (DELTASONE) 10 MG tablet Take 10 mg by mouth daily. 07/17/19  Yes [provider]  TIADYLT ER 420 MG 24 hr capsule Take 420 mg by mouth daily. 07/31/19  Yes [provider]   Allergies  Allergen Reactions  . Lisinopril Swelling    Swelling of the tongue     FAMILY HISTORY:  family history is not on file. SOCIAL HISTORY:  reports that he has been smoking. He has never used smokeless tobacco.  REVIEW OF SYSTEMS:   Unable to assess due to intubation and sedation  SUBJECTIVE:  Unable to assess due to intubation and sedation  VITAL SIGNS: Temp:  [97.4 F (36.3 C)-98.3 F (36.8 C)] 97.7 F (36.5 C) (03/31 1142) Pulse Rate:  [68-116] 109 (03/31 1530) Resp:  [12-30] 24 (03/31 1530) BP: (63-214)/(38-116) 81/55 (03/31 1530) SpO2:  [51 %-100 %] 93 % (03/31 1530) FiO2 (%):  [50 %-100 %] 50 % (03/31 1142) Weight:  [68.2 kg] 68.2 kg (03/31 0500)  PHYSICAL EXAMINATION: General:  Acutely ill appearing male, laying in bed, intubated and sedated, in NAD Neuro:  Sedated, withdraws from pain HEENT:  Atraumatic, normocephalic, neck supple, no JVD Cardiovascular:  Regular rate & rhythm, s1s2, no M/R/G, 2+ pulses Lungs:  Severely diminished to right side, mild expiratory wheezing to left, vent assisted, even, nonlabored Abdomen:  Soft, nontender, nondistended, no guarding or rebound tenderness, BS+ x4 Musculoskeletal:  Normal bulk and tone, no deformities, no edema  Skin:  Warm and dry.  No obvious rashes, lesions, or ulcerations  Recent Labs  Lab 07/16/2019 1519 07/13/2019 2042 08/05/19 0343  NA 132*  --  133*  K 2.8*  --  3.9  CL 93*  --  97*  CO2 25  --  24  BUN 12  --  19  CREATININE 1.01 1.21 1.15  GLUCOSE 122*  --  187*   Recent Labs  Lab 08/03/2019 1519 07/16/2019 2042 08/05/19 0343  HGB 16.2 16.1 14.5  HCT 44.7 45.3 40.9  WBC  14.2* 17.8* 20.0*  PLT 322 354 296   DG Abd 1 View  Result Date: 08/05/2019 CLINICAL DATA:  NG tube placement. EXAM: ABDOMEN - 1 VIEW COMPARISON:  None. FINDINGS: The NG tube is coursing down the esophagus and into the stomach. Tip is along the lateral body wall. The upper abdominal bowel gas pattern is unremarkable. IMPRESSION: The NG tube is in the stomach. Electronically Signed   By: Marijo Sanes M.D.   On: 08/05/2019 05:42   CT Angio Chest PE W and/or Wo Contrast  Result Date: 07/09/2019 CLINICAL DATA:  Short of breath, hypoxic, respiratory failure, COPD EXAM: CT ANGIOGRAPHY CHEST WITH CONTRAST TECHNIQUE: Multidetector CT imaging of the chest was performed using the standard protocol during bolus administration of intravenous contrast. Multiplanar CT image reconstructions and MIPs were obtained to evaluate the vascular anatomy. CONTRAST:  3m OMNIPAQUE IOHEXOL 350 MG/ML SOLN COMPARISON:  07/08/2019, 04/04/2019 FINDINGS: Cardiovascular: This is a technically adequate evaluation of the pulmonary vasculature. There are no filling defects or pulmonary emboli. The heart is unremarkable without pericardial effusion. Thoracic aorta is normal in caliber without aneurysm or dissection. Mild atherosclerosis of the coronary vessels. Mediastinum/Nodes: There is obstructing soft tissue mass occluding the right mainstem bronchus, measuring 3.7 x 2.7 cm on axial image 41, and extending approximately 3.2 cm in craniocaudal direction coronal image 46. Findings are compatible with central obstructing neoplasm. Pathologic adenopathy is seen within the pretracheal, right hilar, subcarinal, and AP window distributions. Largest lymph node in the precarinal region measures 19 mm and in the subcarinal region measures 23 mm in short axis. Enteric catheter extends into the gastric lumen. Thyroid is unremarkable. Endotracheal tube is identified, tip well above carina. Lungs/Pleura: Soft tissue mass obstructing the right  mainstem bronchus is noted. There is complete atelectasis of the right lower lobe. Patchy atelectasis of the right middle lobe is noted. Nodular ground-glass airspace disease within the right upper lobe consistent with postobstructive pneumonia. There is background emphysema. The left chest is clear. No pleural effusion or pneumothorax. 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 1.7 cm cyst within the right lobe liver is noted. Indeterminate mass arises from the upper pole right kidney measuring 3.3 x 3.7 cm, mean Hounsfield attenuation of 21. Other bilateral renal cysts are noted. Musculoskeletal: There are no acute displaced fractures. Nonspecific sclerotic foci right aspect T9 vertebral body and anterior aspect T12 vertebral body. No destructive lesions. Reconstructed images demonstrate no additional findings. Review of the MIP images confirms the above findings. IMPRESSION: 1. Soft tissue mass obstructing the right upper lobe bronchus compatible with malignancy. This is amenable to endobronchial sampling. 2. No evidence of pulmonary embolus. 3. Metastatic adenopathy throughout the mediastinum and right hilum. 4. Cystic and solid right adrenal mass highly concerning for metastatic disease. 5. Indeterminate lesion upper pole right kidney may require further evaluation with MRI or ultrasound.  6. Extensive postobstructive changes within the right lung, with complete atelectasis of the right lower lobe and partial right middle lobe atelectasis. Likely postobstructive pneumonia within the right upper lobe. 7. Support devices as above. Electronically Signed   By: Randa Ngo M.D.   On: 07/06/2019 18:08   US Venous Img Lower Bilateral (DVT)  Result Date: 08/05/2019 CLINICAL DATA:  Elevated D-dimer. EXAM: BILATERAL LOWER EXTREMITY VENOUS DOPPLER ULTRASOUND TECHNIQUE: Gray-scale sonography with graded compression, as well as  color Doppler and duplex ultrasound were performed to evaluate the lower extremity deep venous systems from the level of the common femoral vein and including the common femoral, femoral, profunda femoral, popliteal and calf veins including the posterior tibial, peroneal and gastrocnemius veins when visible. The superficial great saphenous vein was also interrogated. Spectral Doppler was utilized to evaluate flow at rest and with distal augmentation maneuvers in the common femoral, femoral and popliteal veins. COMPARISON:  None. FINDINGS: RIGHT LOWER EXTREMITY Common Femoral Vein: No evidence of thrombus. Normal compressibility, respiratory phasicity and response to augmentation. Saphenofemoral Junction: No evidence of thrombus. Normal compressibility and flow on color Doppler imaging. Profunda Femoral Vein: No evidence of thrombus. Normal compressibility and flow on color Doppler imaging. Femoral Vein: No evidence of thrombus. Normal compressibility, respiratory phasicity and response to augmentation. Popliteal Vein: No evidence of thrombus. Normal compressibility, respiratory phasicity and response to augmentation. Calf Veins: No evidence of thrombus. Normal compressibility and flow on color Doppler imaging. Superficial Great Saphenous Vein: No evidence of thrombus. Normal compressibility. Other Findings:  None. LEFT LOWER EXTREMITY Common Femoral Vein: No evidence of thrombus. Normal compressibility, respiratory phasicity and response to augmentation. Saphenofemoral Junction: No evidence of thrombus. Normal compressibility and flow on color Doppler imaging. Profunda Femoral Vein: No evidence of thrombus. Normal compressibility and flow on color Doppler imaging. Femoral Vein: No evidence of thrombus. Normal compressibility, respiratory phasicity and response to augmentation. Popliteal Vein: No evidence of thrombus. Normal compressibility, respiratory phasicity and response to augmentation. Calf Veins: No evidence  of thrombus. Normal compressibility and flow on color Doppler imaging. Superficial Great Saphenous Vein: No evidence of thrombus. Normal compressibility. Other Findings:  None. IMPRESSION: No evidence of deep venous thrombosis in either lower extremity. Electronically Signed   By: Constance Holster M.D.   On: 08/05/2019 15:12   DG Chest Port 1 View  Result Date: 08/05/2019 CLINICAL DATA:  Respiratory failure. Lung mass. EXAM: PORTABLE CHEST 1 VIEW COMPARISON:  07/07/2019 FINDINGS: The endotracheal tube and NG tubes are stable. Right hilar lesion with obstructive atelectasis and significant loss of volume in the right lung. The left lung appears relatively clear. External pacer paddles are noted. No pneumothorax. The bony thorax is intact. IMPRESSION: 1. Stable support apparatus. 2. Persistent right hilar lesion with obstructive atelectasis and significant loss of volume in the right lung. Electronically Signed   By: Marijo Sanes M.D.   On: 08/05/2019 05:42   DG Chest Portable 1 View  Result Date: 07/08/2019 CLINICAL DATA:  Post intubation EXAM: PORTABLE CHEST 1 VIEW COMPARISON:  07/16/2019, 04/04/2019 FINDINGS: Interval intubation, tip of the endotracheal tube is about 4.2 cm superior to carina. Placement of esophageal tube, the tip projects over the GE junction region. Left lung grossly clear. Volume loss right hemithorax with shift to the right. Increasing opacity at the right base. Stable cardiomediastinal silhouette. IMPRESSION: 1. Endotracheal tube tip about 4.2 cm superior to carina. Esophageal tube tip overlies GE junction region, consider further advancement for more optimal positioning 2. Volume loss  within the right thorax with shift of contents to the right. Increasing airspace disease at the right base. Further evaluation with chest CT previously suggest. Electronically Signed   By: Donavan Foil M.D.   On: 07/18/2019 17:20   DG Chest Portable 1 View  Result Date: 07/14/2019 CLINICAL DATA:   Worsening shortness of breath, hypertension, tobacco abuse EXAM: PORTABLE CHEST 1 VIEW COMPARISON:  04/04/2019 FINDINGS: Single frontal view of the chest demonstrates new volume loss right hemithorax. There is elevation of the right mid diaphragm, mediastinal shift to the right, increased density in the right paratracheal region. This could reflect right upper lobe collapse. Patchy consolidation at the right lung base could reflect superimposed airspace disease. Left chest is clear. No acute bony abnormalities. IMPRESSION: 1. Right-sided volume loss, which may reflect right upper lobe collapse. 2. Patchy consolidation at the right lung base which may be hypoventilatory or airspace disease. 3. Further evaluation with CT chest with contrast may be useful. Electronically Signed   By: Randa Ngo M.D.   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  She will discuss with remainder of patients family and update [place] soon.   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 -Oncology consulted, appreciate input -Palliative care consulted, appreciate input    Patient is critically ill and prognosis is guarded, high risk for cardiac arrest and death    BEST PRACTICES: DISPOSITION: ICU GOALS OF CARE: GOALS OF CARE VTE PROPHYLAXIS: SQ LOVENOX STRESS ULCER PROPHYLAXIS: IV PROTONIX CONSULTS: ONCOLOGY, PALLIATIVE CARE  Critical care provider statement:    Critical care time (minutes):  35   Critical care time was exclusive of:  Separately billable procedures and  treating other patients   Critical care was necessary to treat or prevent imminent or  life-threatening deterioration of the following conditions:  acute hypoxemic respiratory failure, lung mass, hemoptysis, copd , multiple comorbid conditions.    Critical care was time spent personally by me on the following  activities:  Development of treatment plan with patient or surrogate,  discussions with consultants, evaluation of patient's response to  treatment, examination of patient, obtaining history from patient or  surrogate, ordering and performing treatments and interventions, ordering  and review of laboratory studies and re-evaluation of patient's condition   I assumed direction of critical care for this patient from another  provider in my specialty: no     Ottie Glazier, M.D.  [facility] [place]

## 2019-08-05 NOTE — Progress Notes (Addendum)
   08/05/19 1645  Clinical Encounter Type  Visited With Patient and family together  Visit Type Initial;Spiritual support  Referral From Nurse  Consult/Referral To Chaplain  Chaplain visited with patient and family in response to a page. Patient is actively transitioning. Patient' wife, sister, sister-in-law, and niece were at bedside. Nurse Lovena Le was bending down softly speaking to the patient's wife. Lovena Le was explaining what the family could expect in the coming hours. Family was appropriately grieving. Lovena Le explained to Chaplain that they were waiting on the patient's sister and father to arrive before taking him off of the ventilator. While Lovena Le was talking to Chaplain patient's other sister arrived with his niece. Mrs. Dohse asked several times when patient's father would be arriving and one of his niece's texted her grandfather for his arrival time.While waiting for patient's father, his brother-in-law arrived. Because of the number of people in the room, several went to the waiting room. Chaplain asked about a comfort cart for this family and nurse explained that patient was not on comfort care yet, but shortly after conversation with Lovena Le a comfort cart arrived. Patient's father arrived and Lovena Le explained everything to him, making him know a decision was needed. Patient's father said I am leaving everything up to his wife. The consensus was to make patient comfort and remove him from ventilator. While waiting for respiratory team to finish treating patient the family went to the waiting room. Patient's father stepped in the hall and started talking to Grace. He talked about the importance of taking care of one's self, explaining that he takes good care of himself. He also said that he doesn't drink nor smoke. It hurts to see his son lying there in this current condition. It was discussed how his son may have known that he was sick, but didn't realize how sick he was.The patient's  father talked with Chaplain for about 20 minutes. It was a pleasure talking with him. Since it was shift changing time, Lovena Le explained they were waiting for the respiratory team to come, but while waiting, the family was brought back to patient's room. When respiratory staff arrived family stepped out of the room and patient was removed from ventilator. Once family reentered the room, Chaplain heard Lovena Le informing patient of those that were around him. Chaplain also noticed Taylor rubbing patient's forehead and holding his hands. When asked how long it would be before patient dies, Lovena Le explained it could be hours or it could be a day. Around 7:35 Chaplain left for a while to visit with another patient. While there Chaplain offered pastoral presence and empathy. Early on she offered to pray and patient sister-in-law asked wife if she wanted prayer and she did not respond, therefore Chaplain silently prayed the entire time.

## 2019-08-05 NOTE — Progress Notes (Signed)
Spoke to Dr. Loni Muse on how long to wait between discontinuing Nimbex drip between extubating patient to comfort care.  Nimbex drip discontinued at [place].

## 2019-08-05 NOTE — Progress Notes (Signed)
Initial Nutrition Assessment  DOCUMENTATION CODES:   Non-severe (moderate) malnutrition in context of chronic illness  INTERVENTION:   If tube feeds initiated, recommend:  Vital 1.2 @20ml /hr + Prostat 51ml QID via tube   Propofol: 32 ml/hr- provides 845kcal/day   Free water flushes 86ml q4 hours to maintain tube patency   Regimen provides 1821kcal/day, 96g/day protein and 576ml/day free water  Recommend liquid MVI daily via tube   Pt likely at high refeed risk   NUTRITION DIAGNOSIS:   Moderate Malnutrition related to chronic illness(COPD) as evidenced by mild to moderate fat depletions, moderate to severe muscle depletions.  GOAL:   Provide needs based on ASPEN/SCCM guidelines  MONITOR:   Vent status, Labs, Weight trends, Skin, I & O's  REASON FOR ASSESSMENT:   Ventilator    ASSESSMENT:   59 y.o. 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   Pt sedated and ventilated. Family at bedside reports patient with poor appetite and oral intake at baseline. Pt does enjoy Ensure. Per chart, pt has lost 18lbs(11%) over the past year and 10lbs(6%) over the past 4 months. OGT in place. No plans for tube feeds today. Palliative care following for GOC; family leaning towards comfort care.   Medications reviewed and include: aspirin, insulin, solu-medrol, protonix, cefepime, propofol   Labs reviewed: Na 133(L), P 4.8(H), Mg 1.6(L) Wbc- 20.0(H) cbgs- 163, 158, 184 x 24 hrs AIC 5.9(H)  Patient is currently intubated on ventilator support MV: 9.4 L/min Temp (24hrs), Avg:98 F (36.7 C), Min:97.4 F (36.3 C), Max:98.6 F (37 C)  Propofol: 32 ml/hr- provides 845kcal/day   MAP- 42-41mmHg  UOP- 761ml output  NUTRITION - FOCUSED PHYSICAL EXAM:    Most Recent Value   Orbital Region  Mild depletion  Upper Arm Region  Moderate depletion  Thoracic and Lumbar Region  No depletion  Buccal Region  No depletion  Temple Region  Moderate depletion  Clavicle Bone Region  Severe depletion  Clavicle and Acromion Bone Region  Severe depletion  Scapular Bone Region  Unable to assess  Dorsal Hand  Moderate depletion  Patellar Region  Severe depletion  Anterior Thigh Region  Severe depletion  Posterior Calf Region  Severe depletion  Edema (RD Assessment)  None  Hair  Reviewed  Eyes  Reviewed  Mouth  Reviewed  Skin  Reviewed  Nails  Reviewed     Diet Order:   Diet Order            Diet NPO time specified  Diet effective now             EDUCATION NEEDS:   No education needs have been identified at this time  Skin:  Skin Assessment: Reviewed RN Assessment  Last BM:  PTA  Height:   Ht Readings from Last 1 Encounters:  07/14/2019 6' (1.829 m)    Weight:   Wt Readings from Last 1 Encounters:  08/05/19 68.2 kg    Ideal Body Weight:  80.9 kg  BMI:  Body mass index is 20.39 kg/m.  Estimated Nutritional Needs:   Kcal:  1725kcal/day  Protein:  100-115g/day  Fluid:  >2.0L/day  Knox Saliva MS, RD, LDN Please refer to [facility] for RD and/or RD on-call/weekend/after hours pager

## 2019-08-05 NOTE — Progress Notes (Signed)
[facility] for Electrolyte Monitoring and Replacement   Recent Labs: Potassium (mmol/L)  Date Value  08/05/2019 3.9   Magnesium (mg/dL)  Date Value  08/05/2019 1.6 (L)   Calcium (mg/dL)  Date Value  08/05/2019 8.5 (L)   Phosphorus (mg/dL)  Date Value  08/05/2019 4.8 (H)   Sodium (mmol/L)  Date Value  08/05/2019 133 (L)    Assessment: 59 year old male admitted to ICU on mechanical ventilation. Pharmacy consulted for electrolyte management.  Goal of Therapy:  Potassium 4.0 - 5.1 mmol/L Magnesium 2.0 - 2.4 mg/dL All Other Electrolytes WNL  Plan:   Magnesium sulfate 2 grams IV x 1  Potassium level is borderline: no replacement warranted for today  Phosphorous noted to be elevated: renal function slightly improved  follow up all electrolytes with morning labs.  Dallie Piles ,PharmD Clinical Pharmacist 08/05/2019 11:47 AM

## 2019-08-05 NOTE — Progress Notes (Signed)
Pt extubated to comfort care at [facility]. Family and pastoral care person at bedside. Pt kept comfortable.

## 2019-08-05 NOTE — Progress Notes (Signed)
BP significantly dropping. Dr. Loni Muse and I spoke to the wife about next steps. As discussed with palliative care team wife does not want to escalate care at this time. Family was called into the bedside. Awaiting all family members to be present and allow to visit with patient.  Will transition to comfort care when family is ready.

## 2019-08-05 NOTE — Consult Note (Addendum)
Consultation Note Date: 08/05/2019   Patient Name: Richard Mora  DOB: 1960/09/04  MRN: [id number]  Age / Sex: 59 y.o., male  PCP: [place] Pink, MD Referring Physician: Ottie Glazier, MD  Reason for Consultation: Establishing goals of care  HPI/Patient Profile: 59 y.o. 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  Oncology has been consulted.  Clinical Assessment and Goals of Care: Patient is resting in bed on a ventilator. His wife is at bedside with her sister. She states they are married and have no children. She states up until the last few weeks, he was able to do activities with no issue, and required no O2 or assistive devices. She states over the past 2 weeks he has had a cough. She states he went to see his doctor and came home from the visit and has not been the same since. She states he was very quiet, spent time in bed and on the patio.    We discussed his diagnosis, prognosis, GOC, EOL wishes disposition and options.  A detailed discussion was had today regarding advanced directives.  Concepts specific to code status, artifical feeding and hydration, IV antibiotics and rehospitalization were discussed.  The difference between an aggressive medical intervention path and a comfort care path was discussed.  Values and goals of care important to patient and family were attempted to be elicited.  Discussed limitations of medical interventions to prolong quality of life in some situations and discussed the concept of human mortality.  They state they have been advised of his status. She states he would not want to live with a quality of life any less than as free as he was a few months ago.  After discussion, she states she will call his family to see him, and then shift to comfort care with 1 way extubation once they have done this.     SUMMARY OF RECOMMENDATIONS   Wife would like to continue current care without escalation until patient's family has been able to visit with him, and then she would like to shift to comfort care. Sounds as though this will occur within the next couple of days. Will follow up tomorrow.   Prognosis:   Very poor      Primary Diagnoses: Present on Admission: . Acute hypoxemic respiratory failure ([place])   I have reviewed the medical record, interviewed the patient and family, and examined the patient. The following aspects are pertinent.  Past Medical History:  Diagnosis Date  . COPD (chronic obstructive pulmonary disease) ([place])   . Hypertension    Social History   Socioeconomic History  . Marital status: Married    Spouse name: Not on file  . Number of children: Not on file  . Years of education: Not on file  . Highest education level: Not on file  Occupational History  . Not on file  Tobacco Use  . Smoking  status: Current Every Day Smoker  . Smokeless tobacco: Never Used  Substance and Sexual Activity  . Alcohol use: Not on file  . Drug use: Not on file  . Sexual activity: Not on file  Other Topics Concern  . Not on file  Social History Narrative  . Not on file   Social Determinants of Health   Financial Resource Strain:   . Difficulty of Paying Living Expenses:   Food Insecurity:   . Worried About Charity fundraiser in the Last Year:   . Arboriculturist in the Last Year:   Transportation Needs:   . Film/video editor (Medical):   Marland Kitchen Lack of Transportation (Non-Medical):   Physical Activity:   . Days of Exercise per Week:   . Minutes of Exercise per Session:   Stress:   . Feeling of Stress :   Social Connections:   . Frequency of Communication with Friends and Family:   . Frequency of Social Gatherings with  Friends and Family:   . Attends Religious Services:   . Active Member of Clubs or Organizations:   . Attends Archivist Meetings:   Marland Kitchen Marital Status:    No family history on file. Scheduled Meds: . artificial tears  1 application Both Eyes M3W  . aspirin  324 mg Oral NOW   Or  . aspirin  300 mg Rectal NOW  . budesonide (PULMICORT) nebulizer solution  0.5 mg Nebulization BID  . chlorhexidine gluconate (MEDLINE KIT)  15 mL Mouth Rinse BID  . Chlorhexidine Gluconate Cloth  6 each Topical Daily  . insulin aspart  0-15 Units Subcutaneous Q4H  . ipratropium-albuterol  3 mL Nebulization Q4H  . mouth rinse  15 mL Mouth Rinse 10 times per day  . methylPREDNISolone (SOLU-MEDROL) injection  40 mg Intravenous Q6H  . pantoprazole (PROTONIX) IV  40 mg Intravenous QHS  . tranexamic acid  500 mg Nebulization Q8H   Continuous Infusions: . albuterol    . ceFEPime (MAXIPIME) IV 2 g (08/05/19 1337)  . cisatracurium (NIMBEX) infusion 0.5 mcg/kg/min (08/05/19 1515)  . fentaNYL infusion INTRAVENOUS 400 mcg/hr (08/05/19 1515)  . midazolam 1 mg/hr (08/05/19 1515)  . propofol (DIPRIVAN) infusion 80 mcg/kg/min (08/05/19 1515)   PRN Meds:.fentaNYL, midazolam, midazolam Medications Prior to Admission:  Prior to Admission medications   Medication Sig Start Date End Date Taking? Authorizing Provider  albuterol (VENTOLIN HFA) 108 (90 Base) MCG/ACT inhaler Inhale 1-2 puffs into the lungs every 4 (four) hours as needed for shortness of breath or wheezing. 08/03/19  Yes [provider]  ANORO ELLIPTA 62.5-25 MCG/INH AEPB Inhale 1 puff into the lungs daily. 07/17/19  Yes [provider]  aspirin EC 81 MG tablet Take 81 mg by mouth daily.   Yes [provider]  benzonatate (TESSALON) 100 MG capsule Take 100 mg by mouth 3 (three) times daily as needed for cough. 07/20/19  Yes [provider]  hydrochlorothiazide (HYDRODIURIL) 12.5 MG tablet Take 12.5 mg by mouth daily.  08/01/19  Yes [provider]  metoprolol succinate (TOPROL-XL) 50 MG 24 hr tablet Take 50 mg by mouth 2 (two) times daily. 07/31/19  Yes [provider]  montelukast (SINGULAIR) 10 MG tablet Take 10 mg by mouth at bedtime. 07/21/19  Yes [provider]  Multiple Vitamin (MULTIVITAMIN WITH MINERALS) TABS tablet Take 1 tablet by mouth daily.   Yes [provider]  predniSONE (DELTASONE) 10 MG tablet Take 10 mg by mouth daily.  07/17/19  Yes [provider]  TIADYLT ER 420 MG 24 hr capsule Take 420 mg by mouth daily. 07/31/19  Yes [provider]   Allergies  Allergen Reactions  . Lisinopril Swelling    Swelling of the tongue    Review of Systems  Unable to perform ROS   Physical Exam Constitutional:      Comments: On ventilator.   Skin:    General: Skin is warm and dry.     Vital Signs: BP (!) 81/55   Pulse (!) 109   Temp 97.7 F (36.5 C) (Oral)   Resp (!) 24   Ht 6' (1.829 m)   Wt 68.2 kg   SpO2 93%   BMI 20.39 kg/m  Pain Scale: CPOT   Pain Score: Asleep   SpO2: SpO2: 93 % O2 Device:SpO2: 93 % O2 Flow Rate: .O2 Flow Rate (L/min): 60 L/min  IO: Intake/output summary:   Intake/Output Summary (Last 24 hours) at 08/05/2019 1541 Last data filed at 08/05/2019 1515 Gross per 24 hour  Intake 3808.02 ml  Output 1150 ml  Net 2658.02 ml    LBM: Last BM Date: (PTA) Baseline Weight: Weight: 66.7 kg Most recent weight: Weight: 68.2 kg     Palliative Assessment/Data:     Time In: 3:10 Time Out: 3:40 Time Total: 30 min Greater than 50%  of this time was spent counseling and coordinating care related to the above assessment and plan.  Signed by: Asencion Gowda, NP   Please contact Palliative Medicine Team phone at [phone] for questions and concerns.  For individual provider: See Shea Evans

## 2019-08-06 LAB — CULTURE, BLOOD (ROUTINE X 2): Special Requests: ADEQUATE

## 2019-08-06 DEATH — deceased

## 2019-08-07 LAB — CYTOLOGY - NON PAP

## 2019-08-09 LAB — CULTURE, BLOOD (ROUTINE X 2)
Culture: NO GROWTH
Special Requests: ADEQUATE

## 2019-09-05 NOTE — Death Summary Note (Signed)
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 year old male with a past medical history notable for COPD, hypertension, former smoker (40-year history of smoking).  He presents to [facility] ED on 08/27/2019 with complaints of shortness of breath.  He is currently intubated and sedated, no family is present, therefore history is obtained from ED and nursing notes.  Per notes he reported an approximate 43-month history of shortness of breath, which is progressively worsened over the past week.  He was seen by his Pulmonologist Dr. Raul Del on 07/21/2019 for COPD exacerbation, and was given inhalers, nebulizers, and prednisone, but did not obtain any significant relief.  He denied chest pain or cough.  Upon presentation to ED he was noted to be severely hypoxic, which he was placed on non-rebreather.  Despite nonrebreather he remains significantly hypoxic with O2 sats ranging from the 70s to 80s, which he was subsequently intubated.  Chest x-ray showed right upper lobe collapse.  CTA Chest was obtained which  revealed an obstructing mass of the right mainstem bronchus consistent with central obstructing neoplasm, along with groundglass airspace disease of the right upper lobe consistent with postobstructive pneumonia, along with emphysema.  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  Lab work was concerning for sodium 132, potassium 2.8, WBC 14.2, procalcitonin 1.83, lactic acid 3.4, BNP 146, D-dimer 3251.  His SARS-CoV-2 PCR is negative, influenza PCR is negative.  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  Oncology has been consulted.  After arrival to ICU he remained severely hypoxic despite 100% FiO2 on the vent and 10 of PEEP.  He required Nimbex drip and positioning on left side (right lung up) with noted improvement and O2 saturations.  Given his critical illness and poor prognosis his wife decided to make him DNR/DNI.  Palliative care has been consulted with further assistance in establishing goals of care.  On 3/31 he became hypotensive with significant hemoptysis from the ET tube.  His family elected to transition to comfort care measures only late in the evening.  He expired early in the morning on 08-29-22.    Pertinent Labs and Studies  Significant Diagnostic Studies DG Abd 1 View  Result Date: 08/05/2019 CLINICAL DATA:  NG tube placement. EXAM: ABDOMEN - 1 VIEW COMPARISON:  None. FINDINGS: The NG tube is coursing down the esophagus and into the stomach. Tip is along the lateral body wall. The upper abdominal bowel gas pattern is unremarkable. IMPRESSION: The NG tube  is in the stomach. Electronically Signed   By: Marijo Sanes M.D.   On: 08/05/2019 05:42   CT Angio Chest PE W  and/or Wo Contrast  Result Date: 07/20/2019 CLINICAL DATA:  Short of breath, hypoxic, respiratory failure, COPD EXAM: CT ANGIOGRAPHY CHEST WITH CONTRAST TECHNIQUE: Multidetector CT imaging of the chest was performed using the standard protocol during bolus administration of intravenous contrast. Multiplanar CT image reconstructions and MIPs were obtained to evaluate the vascular anatomy. CONTRAST:  45mL OMNIPAQUE IOHEXOL 350 MG/ML SOLN COMPARISON:  08/03/2019, 04/04/2019 FINDINGS: Cardiovascular: This is a technically adequate evaluation of the pulmonary vasculature. There are no filling defects or pulmonary emboli. The heart is unremarkable without pericardial effusion. Thoracic aorta is normal in caliber without aneurysm or dissection. Mild atherosclerosis of the coronary vessels. Mediastinum/Nodes: There is obstructing soft tissue mass occluding the right mainstem bronchus, measuring 3.7 x 2.7 cm on axial image 41, and extending approximately 3.2 cm in craniocaudal direction coronal image 46. Findings are compatible with central obstructing neoplasm. Pathologic adenopathy is seen within the pretracheal, right hilar, subcarinal, and AP window distributions. Largest lymph node in the precarinal region measures 19 mm and in the subcarinal region measures 23 mm in short axis. Enteric catheter extends into the gastric lumen. Thyroid is unremarkable. Endotracheal tube is identified, tip well above carina. Lungs/Pleura: Soft tissue mass obstructing the right mainstem bronchus is noted. There is complete atelectasis of the right lower lobe. Patchy atelectasis of the right middle lobe is noted. Nodular ground-glass airspace disease within the right upper lobe consistent with postobstructive pneumonia. There is background emphysema. The left chest is clear. No pleural effusion or pneumothorax. Upper Abdomen: Large cystic and solid mass within the right adrenal gland measures 6.5 x 5.8 cm, likely necrotic metastatic  disease. 1.7 cm cyst within the right lobe liver is noted. Indeterminate mass arises from the upper pole right kidney measuring 3.3 x 3.7 cm, mean Hounsfield attenuation of 21. Other bilateral renal cysts are noted. Musculoskeletal: There are no acute displaced fractures. Nonspecific sclerotic foci right aspect T9 vertebral body and anterior aspect T12 vertebral body. No destructive lesions. Reconstructed images demonstrate no additional findings. Review of the MIP images confirms the above findings. IMPRESSION: 1. Soft tissue mass obstructing the right upper lobe bronchus compatible with malignancy. This is amenable to endobronchial sampling. 2. No evidence of pulmonary embolus. 3. Metastatic adenopathy throughout the mediastinum and right hilum. 4. Cystic and solid right adrenal mass highly concerning for metastatic disease. 5. Indeterminate lesion upper pole right kidney may require further evaluation with MRI or ultrasound. 6. Extensive postobstructive changes within the right lung, with complete atelectasis of the right lower lobe and partial right middle lobe atelectasis. Likely postobstructive pneumonia within the right upper lobe. 7. Support devices as above. Electronically Signed   By: Randa Ngo M.D.   On: 07/27/2019 18:08   US Venous Img Lower Bilateral (DVT)  Result Date: 08/05/2019 CLINICAL DATA:  Elevated D-dimer. EXAM: BILATERAL LOWER EXTREMITY VENOUS DOPPLER ULTRASOUND TECHNIQUE: Gray-scale sonography with graded compression, as well as color Doppler and duplex ultrasound were performed to evaluate the lower extremity deep venous systems from the level of the common femoral vein and including the common femoral, femoral, profunda femoral, popliteal and calf veins including the posterior tibial, peroneal and gastrocnemius veins when visible. The superficial great saphenous vein was also interrogated. Spectral Doppler was utilized to evaluate flow at rest and with distal augmentation maneuvers  in the common femoral, femoral and popliteal veins.  COMPARISON:  None. FINDINGS: RIGHT LOWER EXTREMITY Common Femoral Vein: No evidence of thrombus. Normal compressibility, respiratory phasicity and response to augmentation. Saphenofemoral Junction: No evidence of thrombus. Normal compressibility and flow on color Doppler imaging. Profunda Femoral Vein: No evidence of thrombus. Normal compressibility and flow on color Doppler imaging. Femoral Vein: No evidence of thrombus. Normal compressibility, respiratory phasicity and response to augmentation. Popliteal Vein: No evidence of thrombus. Normal compressibility, respiratory phasicity and response to augmentation. Calf Veins: No evidence of thrombus. Normal compressibility and flow on color Doppler imaging. Superficial Great Saphenous Vein: No evidence of thrombus. Normal compressibility. Other Findings:  None. LEFT LOWER EXTREMITY Common Femoral Vein: No evidence of thrombus. Normal compressibility, respiratory phasicity and response to augmentation. Saphenofemoral Junction: No evidence of thrombus. Normal compressibility and flow on color Doppler imaging. Profunda Femoral Vein: No evidence of thrombus. Normal compressibility and flow on color Doppler imaging. Femoral Vein: No evidence of thrombus. Normal compressibility, respiratory phasicity and response to augmentation. Popliteal Vein: No evidence of thrombus. Normal compressibility, respiratory phasicity and response to augmentation. Calf Veins: No evidence of thrombus. Normal compressibility and flow on color Doppler imaging. Superficial Great Saphenous Vein: No evidence of thrombus. Normal compressibility. Other Findings:  None. IMPRESSION: No evidence of deep venous thrombosis in either lower extremity. Electronically Signed   By: Constance Holster M.D.   On: 08/05/2019 15:12   DG Chest Port 1 View  Result Date: 08/05/2019 CLINICAL DATA:  Respiratory failure. Lung mass. EXAM: PORTABLE CHEST 1 VIEW  COMPARISON:  08/05/2019 FINDINGS: The endotracheal tube and NG tubes are stable. Right hilar lesion with obstructive atelectasis and significant loss of volume in the right lung. The left lung appears relatively clear. External pacer paddles are noted. No pneumothorax. The bony thorax is intact. IMPRESSION: 1. Stable support apparatus. 2. Persistent right hilar lesion with obstructive atelectasis and significant loss of volume in the right lung. Electronically Signed   By: Marijo Sanes M.D.   On: 08/05/2019 05:42   DG Chest Portable 1 View  Result Date: 07/07/2019 CLINICAL DATA:  Post intubation EXAM: PORTABLE CHEST 1 VIEW COMPARISON:  07/15/2019, 04/04/2019 FINDINGS: Interval intubation, tip of the endotracheal tube is about 4.2 cm superior to carina. Placement of esophageal tube, the tip projects over the GE junction region. Left lung grossly clear. Volume loss right hemithorax with shift to the right. Increasing opacity at the right base. Stable cardiomediastinal silhouette. IMPRESSION: 1. Endotracheal tube tip about 4.2 cm superior to carina. Esophageal tube tip overlies GE junction region, consider further advancement for more optimal positioning 2. Volume loss within the right thorax with shift of contents to the right. Increasing airspace disease at the right base. Further evaluation with chest CT previously suggest. Electronically Signed   By: Donavan Foil M.D.   On: 08/05/2019 17:20   DG Chest Portable 1 View  Result Date: 08/01/2019 CLINICAL DATA:  Worsening shortness of breath, hypertension, tobacco abuse EXAM: PORTABLE CHEST 1 VIEW COMPARISON:  04/04/2019 FINDINGS: Single frontal view of the chest demonstrates new volume loss right hemithorax. There is elevation of the right mid diaphragm, mediastinal shift to the right, increased density in the right paratracheal region. This could reflect right upper lobe collapse. Patchy consolidation at the right lung base could reflect superimposed  airspace disease. Left chest is clear. No acute bony abnormalities. IMPRESSION: 1. Right-sided volume loss, which may reflect right upper lobe collapse. 2. Patchy consolidation at the right lung base which may be hypoventilatory or airspace disease. 3. Further  evaluation with CT chest with contrast may be useful. Electronically Signed   By: Randa Ngo M.D.   On: 07/15/2019 15:52   ECHOCARDIOGRAM COMPLETE  Result Date: 08/05/2019    ECHOCARDIOGRAM REPORT   Patient Name:   BRYAN GOIN Date of Exam: 08/05/2019 Medical Rec #:  [id number]        Height:       72.0 in Accession #:    [id number]       Weight:       150.4 lb Date of Birth:  December 20, 1960        BSA:          1.887 m Patient Age:    44 years         BP:           132/82 mmHg Patient Gender: M                HR:           73 bpm. Exam Location:  [facility] Procedure: 2D Echo, Cardiac Doppler and Color Doppler Indications:     Bacteremia 790.7  History:         Patient has no prior history of Echocardiogram examinations.                  COPD; Risk Factors:Hypertension.  Sonographer:     Sherrie Sport RDCS (AE) Referring Phys:  [id number] Bradly Bienenstock Diagnosing Phys: Yolonda Kida MD  Sonographer Comments: Technically difficult study due to poor echo windows, no apical window, no subcostal window and echo performed with patient supine and on artificial respirator. Pt has dextracardia. IMPRESSIONS  1. Left ventricular ejection fraction, by estimation, is 55 to 60%. The left ventricle has normal function. The left ventricle has no regional wall motion abnormalities. Left ventricular diastolic parameters were normal.  2. Right ventricular systolic function is normal. The right ventricular size is normal.  3. The mitral valve is grossly normal. Trivial mitral valve regurgitation.  4. The aortic valve is normal in structure. Aortic valve regurgitation is not visualized. FINDINGS  Left Ventricle: Left ventricular ejection fraction, by estimation, is 55 to 60%.  The left ventricle has normal function. The left ventricle has no regional wall motion abnormalities. The left ventricular internal cavity size was normal in size. There is  no left ventricular hypertrophy. Left ventricular diastolic parameters were normal. Right Ventricle: The right ventricular size is normal. No increase in right ventricular wall thickness. Right ventricular systolic function is normal. Left Atrium: Left atrial size was normal in size. Right Atrium: Right atrial size was normal in size. Pericardium: There is no evidence of pericardial effusion. Mitral Valve: The mitral valve is grossly normal. Trivial mitral valve regurgitation. Tricuspid Valve: The tricuspid valve is normal in structure. Tricuspid valve regurgitation is mild. Aortic Valve: The aortic valve is normal in structure. Aortic valve regurgitation is not visualized. Pulmonic Valve: The pulmonic valve was normal in structure. Pulmonic valve regurgitation is not visualized. Aorta: The aortic root is normal in size and structure. IAS/Shunts: The atrial septum is grossly normal.  LEFT VENTRICLE PLAX 2D LVIDd:         3.14 cm LVIDs:         2.22 cm LV PW:         1.00 cm LV IVS:        1.34 cm LVOT diam:     2.00 cm LVOT Area:     3.14 cm  LEFT ATRIUM  Index LA diam:    2.10 cm 1.11 cm/m                        PULMONIC VALVE AORTA                 PV Vmax:        1.06 m/s Ao Root diam: 2.00 cm PV Peak grad:   4.5 mmHg                       RVOT Peak grad: 5 mmHg   SHUNTS Systemic Diam: 2.00 cm Dwayne Prince Rome MD Electronically signed by Yolonda Kida MD Signature Date/Time: 08/05/2019/5:26:33 PM    Final     Microbiology Recent Results (from the past 240 hour(s))  Respiratory Panel by RT PCR (Flu A&B, Covid) - Nasopharyngeal Swab     Status: None   Collection Time: 07/16/2019  4:29 PM   Specimen: Nasopharyngeal Swab  Result Value Ref Range Status   SARS Coronavirus 2 by RT PCR NEGATIVE NEGATIVE Final    Comment:  (NOTE) SARS-CoV-2 target nucleic acids are NOT DETECTED. The SARS-CoV-2 RNA is generally detectable in upper respiratoy specimens during the acute phase of infection. The lowest concentration of SARS-CoV-2 viral copies this assay can detect is 131 copies/mL. A negative result does not preclude SARS-Cov-2 infection and should not be used as the sole basis for treatment or other patient management decisions. A negative result may occur with  improper specimen collection/handling, submission of specimen other than nasopharyngeal swab, presence of viral mutation(s) within the areas targeted by this assay, and inadequate number of viral copies (<131 copies/mL). A negative result must be combined with clinical observations, patient history, and epidemiological information. The expected result is Negative. Fact Sheet for Patients:  PinkCheek.be Fact Sheet for Healthcare Providers:  GravelBags.it This test is not yet ap proved or cleared by the [place] FDA and  has been authorized for detection and/or diagnosis of SARS-CoV-2 by FDA under an Emergency Use Authorization (EUA). This EUA will remain  in effect (meaning this test can be used) for the duration of the COVID-19 declaration under Section 564(b)(1) of the Act, 21 U.S.C. section 360bbb-3(b)(1), unless the authorization is terminated or revoked sooner.    Influenza A by PCR NEGATIVE NEGATIVE Final   Influenza B by PCR NEGATIVE NEGATIVE Final    Comment: (NOTE) The Xpert Xpress SARS-CoV-2/FLU/RSV assay is intended as an aid in  the diagnosis of influenza from Nasopharyngeal swab specimens and  should not be used as a sole basis for treatment. Nasal washings and  aspirates are unacceptable for Xpert Xpress SARS-CoV-2/FLU/RSV  testing. Fact Sheet for Patients: PinkCheek.be Fact Sheet for Healthcare  Providers: GravelBags.it This test is not yet approved or cleared by the [place] FDA and  has been authorized for detection and/or diagnosis of SARS-CoV-2 by  FDA under an Emergency Use Authorization (EUA). This EUA will remain  in effect (meaning this test can be used) for the duration of the  Covid-19 declaration under Section 564(b)(1) of the Act, 21  U.S.C. section 360bbb-3(b)(1), unless the authorization is  terminated or revoked. Performed at [facility], [address] [place], [place] [address]   Culture, blood (routine x 2)     Status: None (Preliminary result)   Collection Time: 07/30/2019  6:15 PM   Specimen: BLOOD  Result Value Ref Range Status   Specimen Description BLOOD BLOOD RIGHT  FOREARM  Final   Special Requests   Final    BOTTLES DRAWN AEROBIC AND ANAEROBIC Blood Culture adequate volume   Culture   Final    NO GROWTH < 12 HOURS Performed at [facility], [address] [place], [place] [address]    Report Status PENDING  Incomplete  Culture, blood (routine x 2)     Status: None (Preliminary result)   Collection Time: 07/07/2019  6:20 PM   Specimen: BLOOD  Result Value Ref Range Status   Specimen Description BLOOD BLOOD LEFT FOREARM  Final   Special Requests   Final    BOTTLES DRAWN AEROBIC AND ANAEROBIC Blood Culture adequate volume   Culture  Setup Time   Final    GRAM POSITIVE RODS AEROBIC BOTTLE ONLY CRITICAL RESULT CALLED TO, READ BACK BY AND VERIFIED WITH: [place] 6073 08/05/19 HNM Performed at [facility], [address] [place], [place] [address]    Culture GRAM POSITIVE RODS  Final   Report Status PENDING  Incomplete  MRSA PCR Screening     Status: None   Collection Time: 07/15/2019  8:24 PM   Specimen: Nasopharyngeal  Result Value Ref Range Status   MRSA by PCR NEGATIVE NEGATIVE Final    Comment:        The GeneXpert MRSA Assay (FDA approved for NASAL specimens only), is  one component of a comprehensive MRSA colonization surveillance program. It is not intended to diagnose MRSA infection nor to guide or monitor treatment for MRSA infections. Performed at [facility], [address] [place], [place] [address]     Lab Basic Metabolic Panel: Recent Labs  Lab 07/08/2019 1519 07/09/2019 2042 07/09/2019 2044 08/05/19 0343  NA 132*  --   --  133*  K 2.8*  --   --  3.9  CL 93*  --   --  97*  CO2 25  --   --  24  GLUCOSE 122*  --   --  187*  BUN 12  --   --  19  CREATININE 1.01 1.21  --  1.15  CALCIUM 9.3  --   --  8.5*  MG  --   --  1.7 1.6*  PHOS  --   --  4.8* 4.8*   Liver Function Tests: No results for input(s): AST, ALT, ALKPHOS, BILITOT, PROT, ALBUMIN in the last 168 hours. No results for input(s): LIPASE, AMYLASE in the last 168 hours. No results for input(s): AMMONIA in the last 168 hours. CBC: Recent Labs  Lab 07/06/2019 1519 07/15/2019 2042 08/05/19 0343  WBC 14.2* 17.8* 20.0*  NEUTROABS  --  16.5*  --   HGB 16.2 16.1 14.5  HCT 44.7 45.3 40.9  MCV 94.5 96.0 96.9  PLT 322 354 296   Cardiac Enzymes: No results for input(s): CKTOTAL, CKMB, CKMBINDEX, TROPONINI in the last 168 hours. Sepsis Labs: Recent Labs  Lab 07/23/2019 1519 07/18/2019 2042 08/03/2019 2345 08/05/19 0343 08/05/19 0500  PROCALCITON  --  1.83  --  2.31 1.84  WBC 14.2* 17.8*  --  20.0*  --   LATICACIDVEN  --  3.4* 1.8  --   --     Procedures/Operations  Endotracheal intubation 3/30        Darel Hong, AGACNP-BC [facility] Pager: [phone]  Bradly Bienenstock 08/25/2019, 1:03 AM

## 2019-09-05 DEATH — deceased

## 2020-08-05 IMAGING — US US EXTREM LOW VENOUS
1 series · 13 of 24 positions shown · non-contrast
Comparison: None.

CLINICAL DATA: Elevated D-dimer.



[Series 1: us venous img lower bilat (dvt) · portal-venous · 13 of 54 slices shown]
[im 1/54]
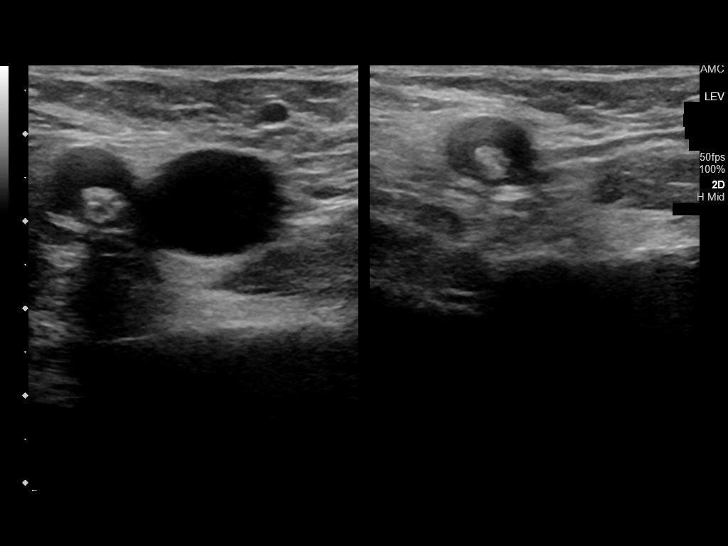
[im 5/54]
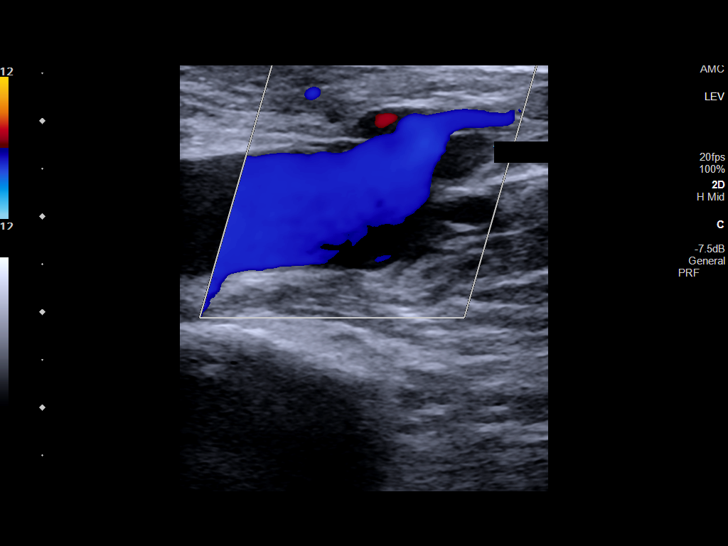
[im 10/54]
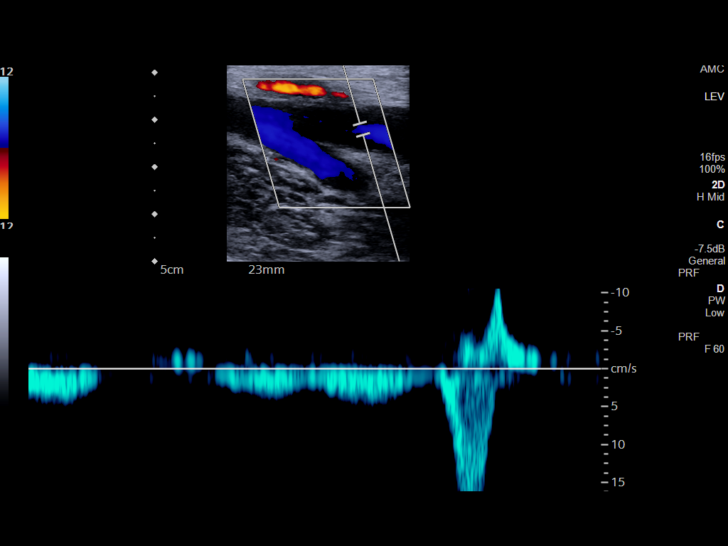
[im 14/54]
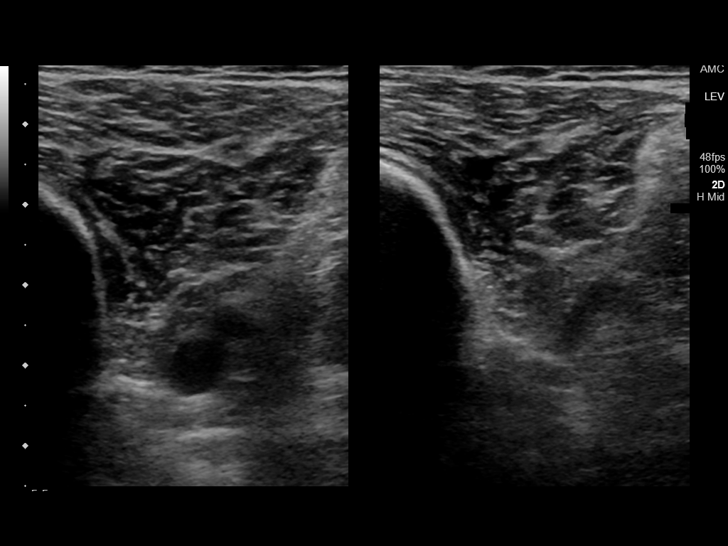
[im 19/54]
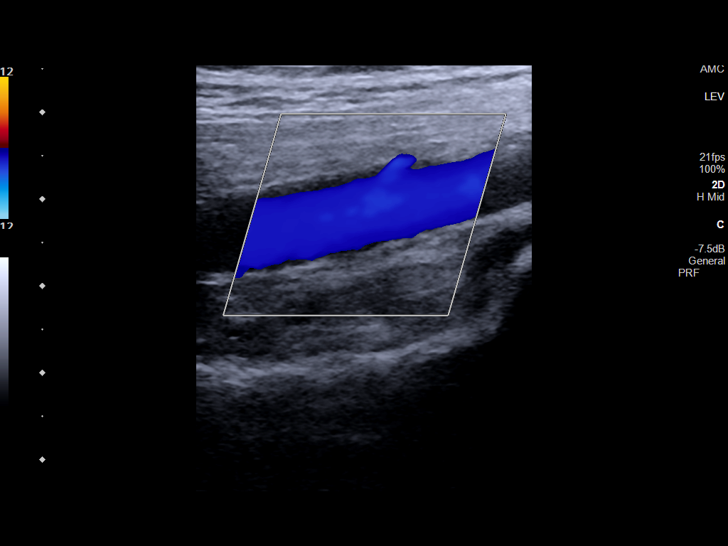
[im 24/54]
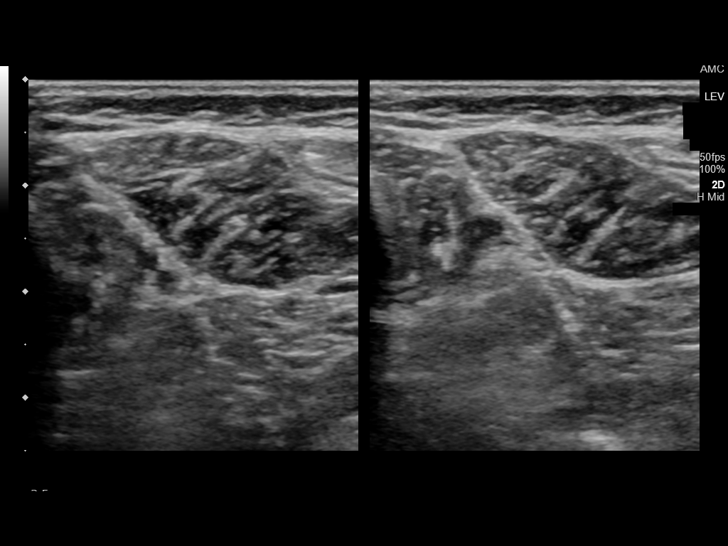
[im 28/54]
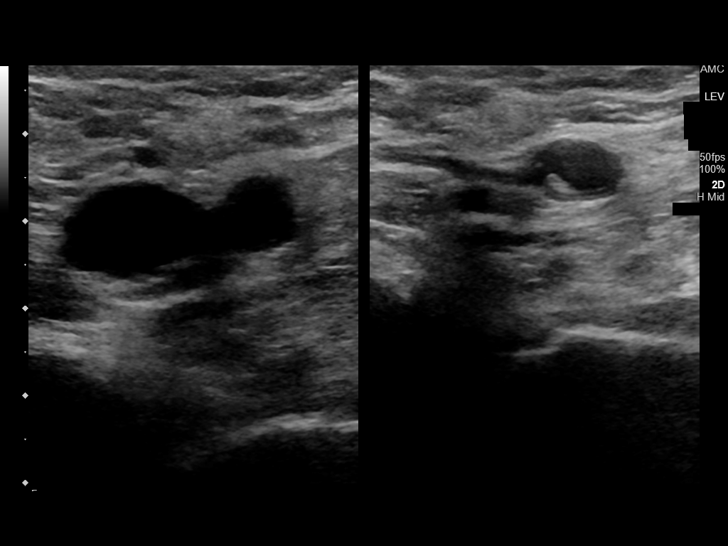
[im 30/54]
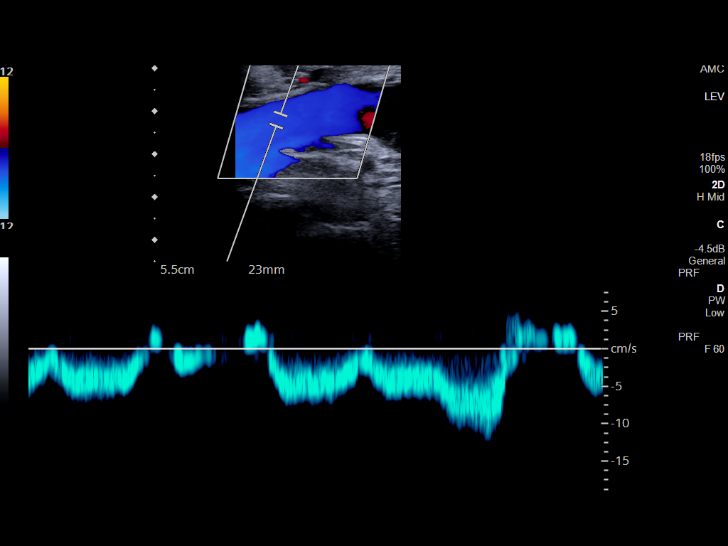
[im 35/54]
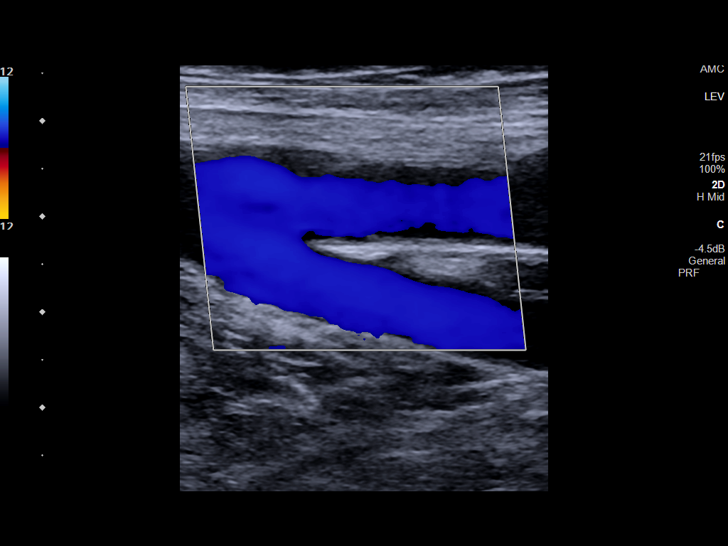
[im 40/54]
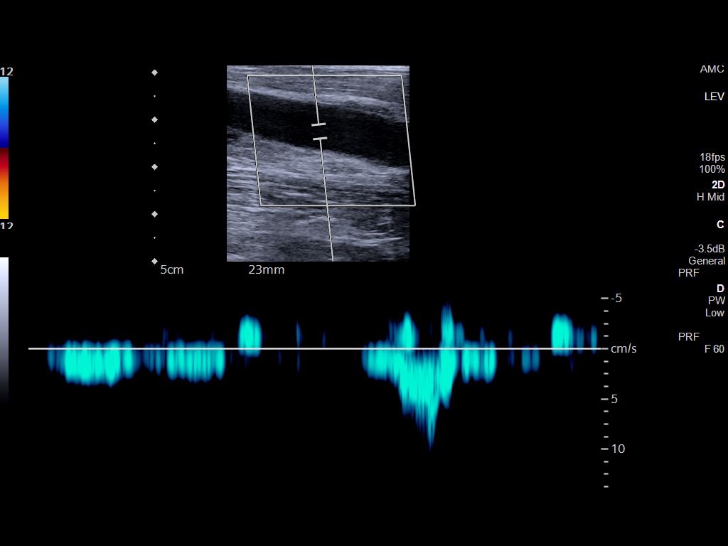
[im 44/54]
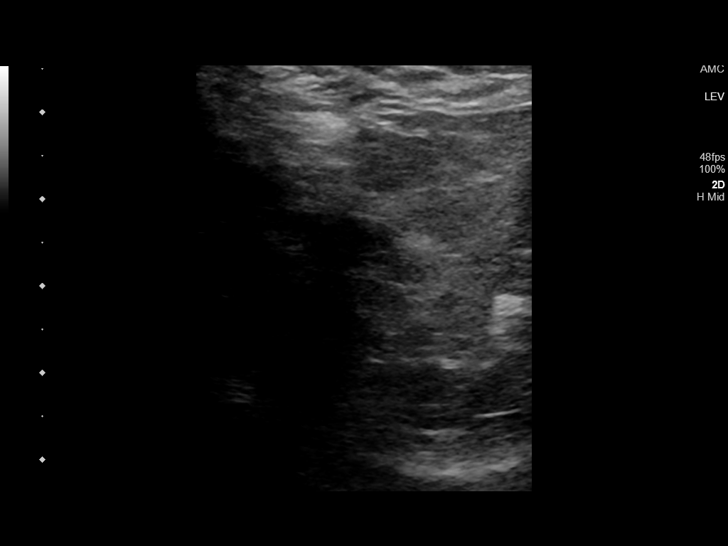
[im 49/54]
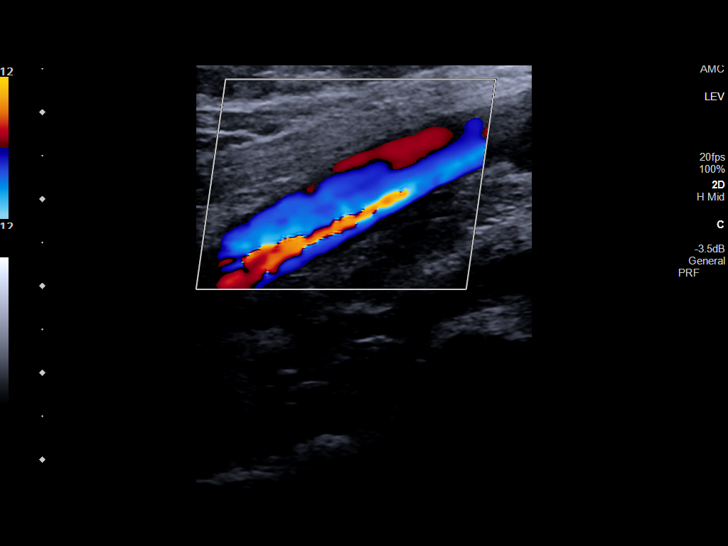
[im 54/54]
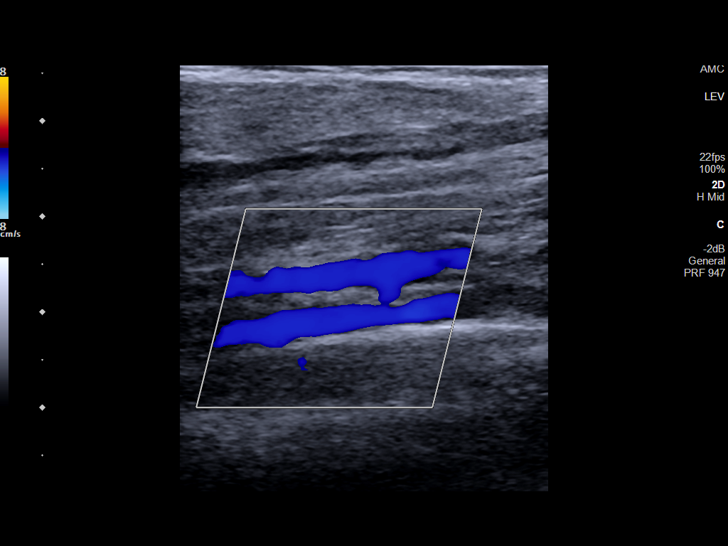

[13 of 24 positions shown; findings below may reference images not displayed]

FINDINGS: RIGHT LOWER EXTREMITY

Common Femoral Vein: No evidence of thrombus. Normal
compressibility, respiratory phasicity and response to augmentation.

Saphenofemoral Junction: No evidence of thrombus. Normal
compressibility and flow on color Doppler imaging.

Profunda Femoral Vein: No evidence of thrombus. Normal
compressibility and flow on color Doppler imaging.

Femoral Vein: No evidence of thrombus. Normal compressibility,
respiratory phasicity and response to augmentation.

Popliteal Vein: No evidence of thrombus. Normal compressibility,
respiratory phasicity and response to augmentation.

Calf Veins: No evidence of thrombus. Normal compressibility and flow
on color Doppler imaging.

Superficial Great Saphenous Vein: No evidence of thrombus. Normal
compressibility.

Other Findings:  None.

LEFT LOWER EXTREMITY

Common Femoral Vein: No evidence of thrombus. Normal
compressibility, respiratory phasicity and response to augmentation.

Saphenofemoral Junction: No evidence of thrombus. Normal
compressibility and flow on color Doppler imaging.

Profunda Femoral Vein: No evidence of thrombus. Normal
compressibility and flow on color Doppler imaging.

Femoral Vein: No evidence of thrombus. Normal compressibility,
respiratory phasicity and response to augmentation.

Popliteal Vein: No evidence of thrombus. Normal compressibility,
respiratory phasicity and response to augmentation.

Calf Veins: No evidence of thrombus. Normal compressibility and flow
on color Doppler imaging.

Superficial Great Saphenous Vein: No evidence of thrombus. Normal
compressibility.

Other Findings:  None.
IMPRESSION: No evidence of deep venous thrombosis in either lower extremity.

## 2020-08-05 IMAGING — DX DG CHEST 1V PORT
1 series · 1 of 1 positions shown · non-contrast
Comparison: 08/04/2019

CLINICAL DATA: Respiratory failure. Lung mass.

EXAM:
PORTABLE CHEST 1 VIEW

[chest ap]
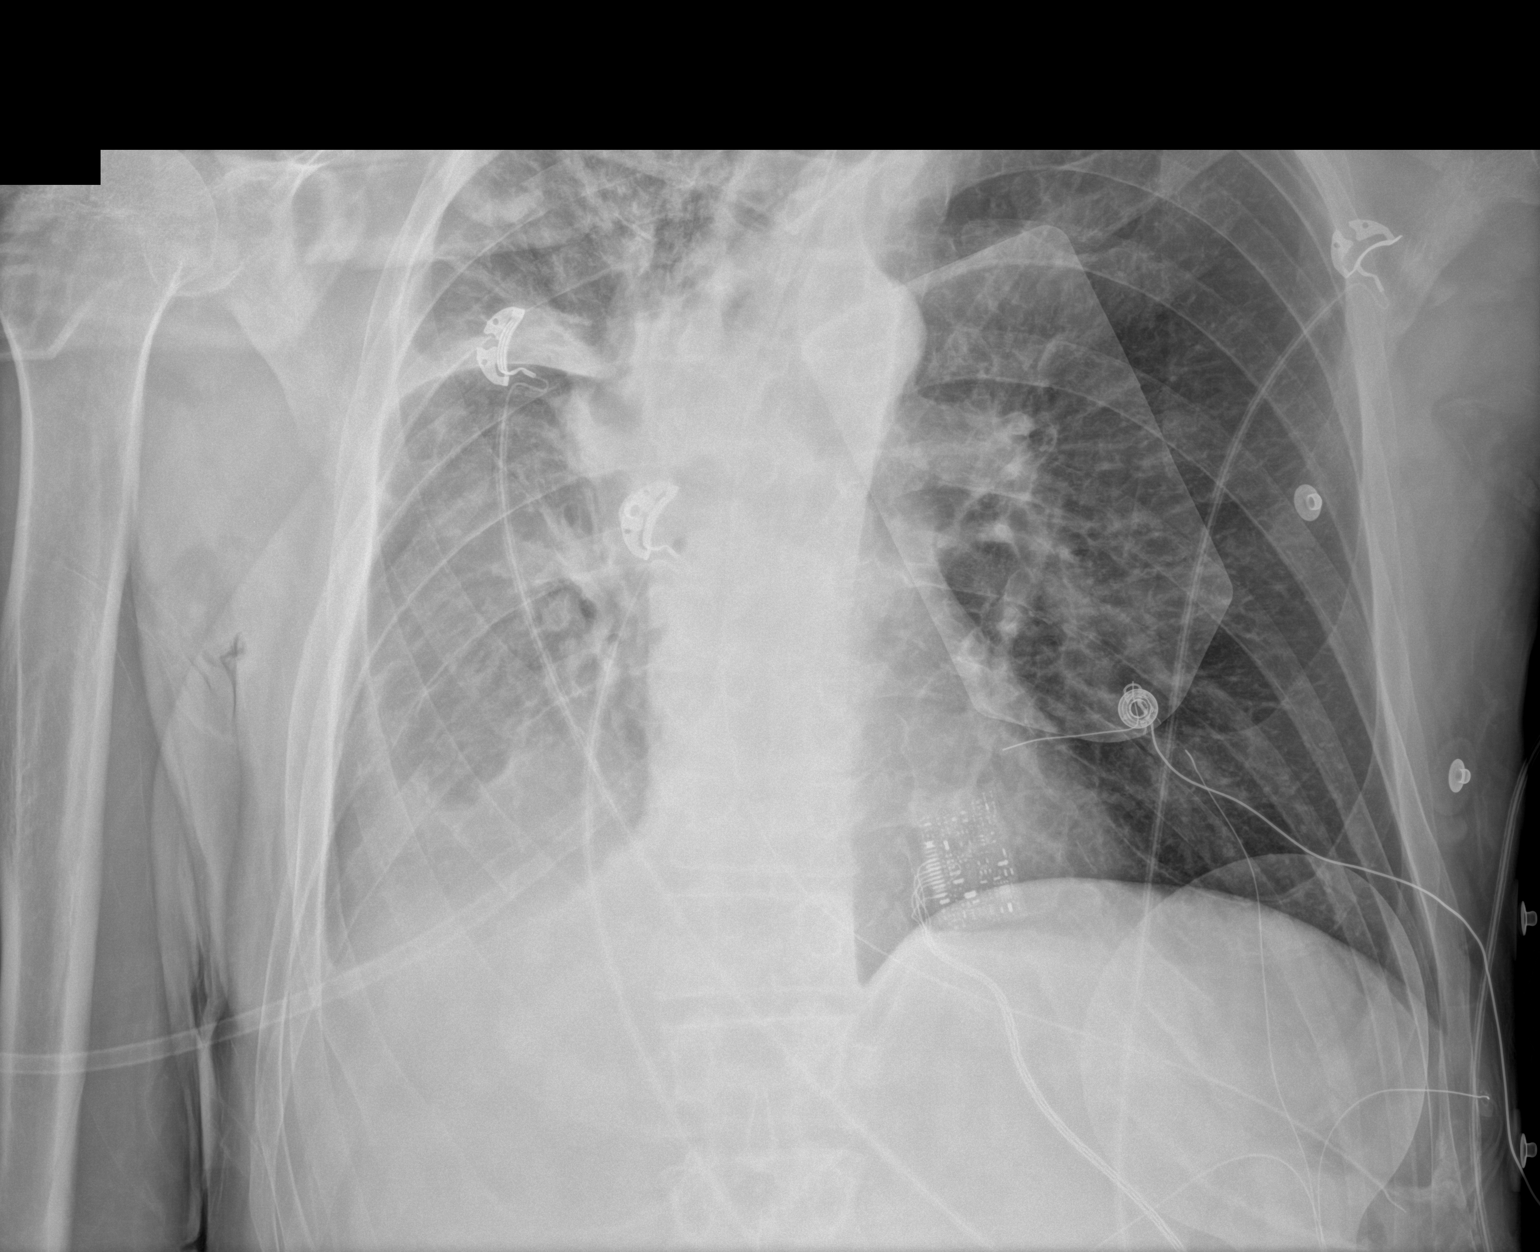

[1 of 1 positions shown; findings below may reference images not displayed]

FINDINGS: The endotracheal tube and NG tubes are stable. Right hilar lesion
with obstructive atelectasis and significant loss of volume in the
right lung. The left lung appears relatively clear. External pacer
paddles are noted. No pneumothorax. The bony thorax is intact.
IMPRESSION: 1. Stable support apparatus.
2. Persistent right hilar lesion with obstructive atelectasis and
significant loss of volume in the right lung.

## 2020-08-05 IMAGING — DX DG ABDOMEN 1V
1 series · 1 of 1 positions shown · non-contrast
Comparison: None.

CLINICAL DATA: NG tube placement.

EXAM:
ABDOMEN - 1 VIEW

[abdomen supine]
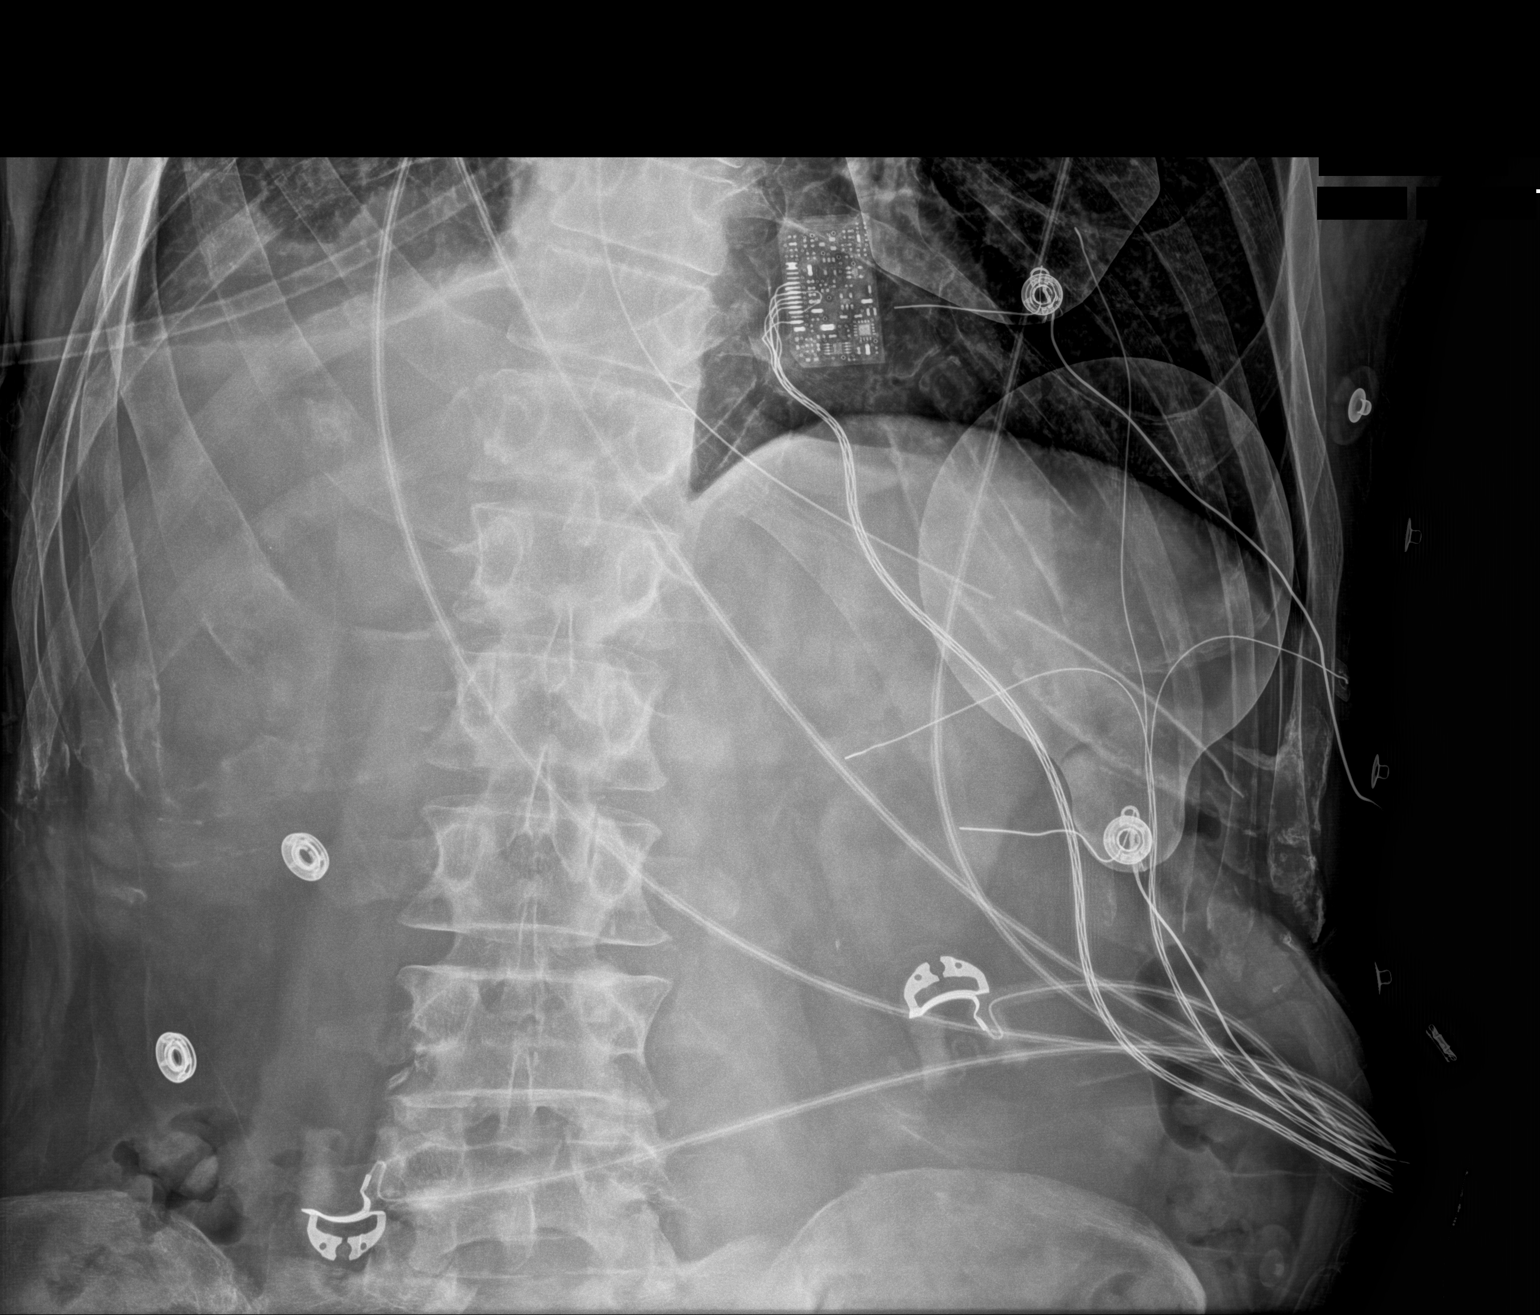

[1 of 1 positions shown; findings below may reference images not displayed]

FINDINGS: The NG tube is coursing down the esophagus and into the stomach. Tip
is along the lateral body wall. The upper abdominal bowel gas
pattern is unremarkable.
IMPRESSION: The NG tube is in the stomach.
